# Patient Record
Sex: Female | Born: 1985 | Race: Black or African American | Hispanic: No | Marital: Married | Smoking: Former smoker
Health system: Southern US, Community
[De-identification: ages and names within clinical notes are randomized; demographics above are authoritative.]

## PROBLEM LIST (undated history)

## (undated) DIAGNOSIS — E66811 Obesity, class 1: Secondary | ICD-10-CM

## (undated) DIAGNOSIS — F419 Anxiety disorder, unspecified: Secondary | ICD-10-CM

## (undated) DIAGNOSIS — D219 Benign neoplasm of connective and other soft tissue, unspecified: Secondary | ICD-10-CM

## (undated) DIAGNOSIS — F32A Depression, unspecified: Secondary | ICD-10-CM

## (undated) DIAGNOSIS — E669 Obesity, unspecified: Secondary | ICD-10-CM

## (undated) DIAGNOSIS — D649 Anemia, unspecified: Secondary | ICD-10-CM

## (undated) DIAGNOSIS — O24419 Gestational diabetes mellitus in pregnancy, unspecified control: Secondary | ICD-10-CM

## (undated) DIAGNOSIS — E119 Type 2 diabetes mellitus without complications: Secondary | ICD-10-CM

## (undated) DIAGNOSIS — F329 Major depressive disorder, single episode, unspecified: Secondary | ICD-10-CM

## (undated) HISTORY — DX: Gestational diabetes mellitus in pregnancy, unspecified control: O24.419

## (undated) HISTORY — DX: Obesity, class 1: E66.811

## (undated) HISTORY — DX: Type 2 diabetes mellitus without complications: E11.9

## (undated) HISTORY — DX: Anemia, unspecified: D64.9

## (undated) HISTORY — DX: Obesity, unspecified: E66.9

---
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
## 2004-05-15 ENCOUNTER — Encounter: Admission: RE | Admit: 2004-05-15 | Discharge: 2004-05-15 | Payer: Self-pay | Admitting: Oncology

## 2004-05-15 ENCOUNTER — Encounter (HOSPITAL_COMMUNITY): Admission: RE | Admit: 2004-05-15 | Discharge: 2004-06-14 | Payer: Self-pay | Admitting: Oncology

## 2004-12-11 ENCOUNTER — Ambulatory Visit: Payer: Self-pay | Admitting: Family Medicine

## 2004-12-13 ENCOUNTER — Ambulatory Visit (HOSPITAL_COMMUNITY): Admission: RE | Admit: 2004-12-13 | Discharge: 2004-12-13 | Payer: Self-pay | Admitting: Family Medicine

## 2005-04-23 ENCOUNTER — Ambulatory Visit: Payer: Self-pay | Admitting: Family Medicine

## 2005-04-24 ENCOUNTER — Ambulatory Visit (HOSPITAL_COMMUNITY): Admission: RE | Admit: 2005-04-24 | Discharge: 2005-04-24 | Payer: Self-pay | Admitting: Family Medicine

## 2005-05-09 ENCOUNTER — Encounter (HOSPITAL_COMMUNITY): Admission: RE | Admit: 2005-05-09 | Discharge: 2005-06-08 | Payer: Self-pay | Admitting: Family Medicine

## 2005-05-09 ENCOUNTER — Ambulatory Visit: Payer: Self-pay | Admitting: Family Medicine

## 2005-05-09 ENCOUNTER — Ambulatory Visit (HOSPITAL_COMMUNITY): Payer: Self-pay | Admitting: Family Medicine

## 2005-05-15 ENCOUNTER — Ambulatory Visit: Payer: Self-pay | Admitting: Internal Medicine

## 2005-05-20 ENCOUNTER — Emergency Department (HOSPITAL_COMMUNITY): Admission: EM | Admit: 2005-05-20 | Discharge: 2005-05-20 | Payer: Self-pay | Admitting: Emergency Medicine

## 2005-05-28 ENCOUNTER — Ambulatory Visit (HOSPITAL_COMMUNITY): Admission: RE | Admit: 2005-05-28 | Discharge: 2005-05-28 | Payer: Self-pay | Admitting: Family Medicine

## 2005-05-30 ENCOUNTER — Ambulatory Visit (HOSPITAL_COMMUNITY): Payer: Self-pay | Admitting: Oncology

## 2005-05-30 ENCOUNTER — Encounter: Admission: RE | Admit: 2005-05-30 | Discharge: 2005-05-30 | Payer: Self-pay | Admitting: Oncology

## 2005-06-15 ENCOUNTER — Ambulatory Visit: Payer: Self-pay | Admitting: Family Medicine

## 2005-07-05 ENCOUNTER — Encounter: Admission: RE | Admit: 2005-07-05 | Discharge: 2005-07-06 | Payer: Self-pay | Admitting: Oncology

## 2005-07-05 ENCOUNTER — Encounter (HOSPITAL_COMMUNITY): Admission: RE | Admit: 2005-07-05 | Discharge: 2005-07-06 | Payer: Self-pay | Admitting: Oncology

## 2005-07-09 ENCOUNTER — Encounter: Admission: RE | Admit: 2005-07-09 | Discharge: 2005-07-09 | Payer: Self-pay | Admitting: Oncology

## 2005-07-17 ENCOUNTER — Ambulatory Visit (HOSPITAL_COMMUNITY): Admission: RE | Admit: 2005-07-17 | Discharge: 2005-07-17 | Payer: Self-pay | Admitting: Family Medicine

## 2005-08-10 ENCOUNTER — Ambulatory Visit: Payer: Self-pay | Admitting: Family Medicine

## 2005-11-29 ENCOUNTER — Ambulatory Visit: Payer: Self-pay | Admitting: Family Medicine

## 2005-12-15 ENCOUNTER — Emergency Department (HOSPITAL_COMMUNITY): Admission: EM | Admit: 2005-12-15 | Discharge: 2005-12-15 | Payer: Self-pay | Admitting: *Deleted

## 2005-12-20 ENCOUNTER — Ambulatory Visit: Payer: Self-pay | Admitting: Family Medicine

## 2006-01-07 ENCOUNTER — Encounter (HOSPITAL_COMMUNITY): Admission: RE | Admit: 2006-01-07 | Discharge: 2006-02-06 | Payer: Self-pay | Admitting: Oncology

## 2006-01-07 ENCOUNTER — Encounter: Admission: RE | Admit: 2006-01-07 | Discharge: 2006-01-07 | Payer: Self-pay | Admitting: Oncology

## 2006-01-07 ENCOUNTER — Ambulatory Visit (HOSPITAL_COMMUNITY): Payer: Self-pay | Admitting: Oncology

## 2006-04-18 ENCOUNTER — Ambulatory Visit: Payer: Self-pay | Admitting: Family Medicine

## 2006-07-13 IMAGING — CR DG ABDOMEN ACUTE W/ 1V CHEST
4 series · 4 of 4 positions shown · non-contrast
Comparison: none

CLINICAL DATA: 18-year-old with constipation.  Patient took Sitz marker capsules on [REDACTED], [DATE].  Right-sided abdominal pain. 
 ACUTE ABDOMEN - 2 VIEW WITH CHEST - 1 VIEW:
 Heart size is normal.  Lungs are free of focal consolidations and pleural effusions.  No evidence for free intraperitoneal air.   Supine and erect views are performed of the abdomen, showing significant stool throughout the colon.  One Sitz marker is seen in the region of the distal transverse colon.  A second Sitz marker is seen in the region of the mid descending colon.  Twelve Sitz markers overlie the region of the sigmoid colon.

[view not recorded (1 of 4)]
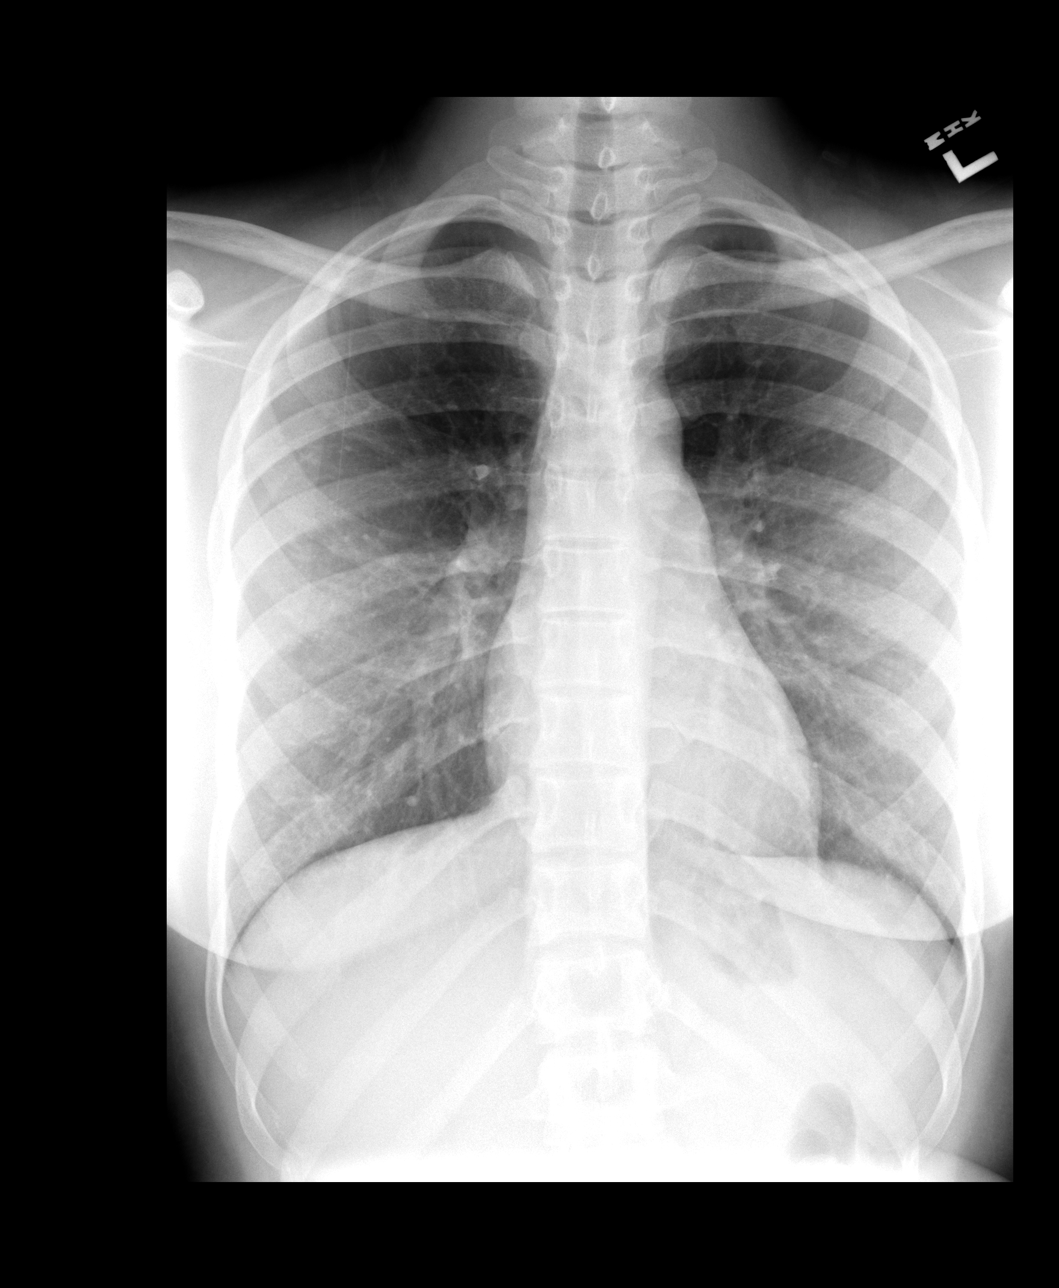

[view not recorded (2 of 4)]
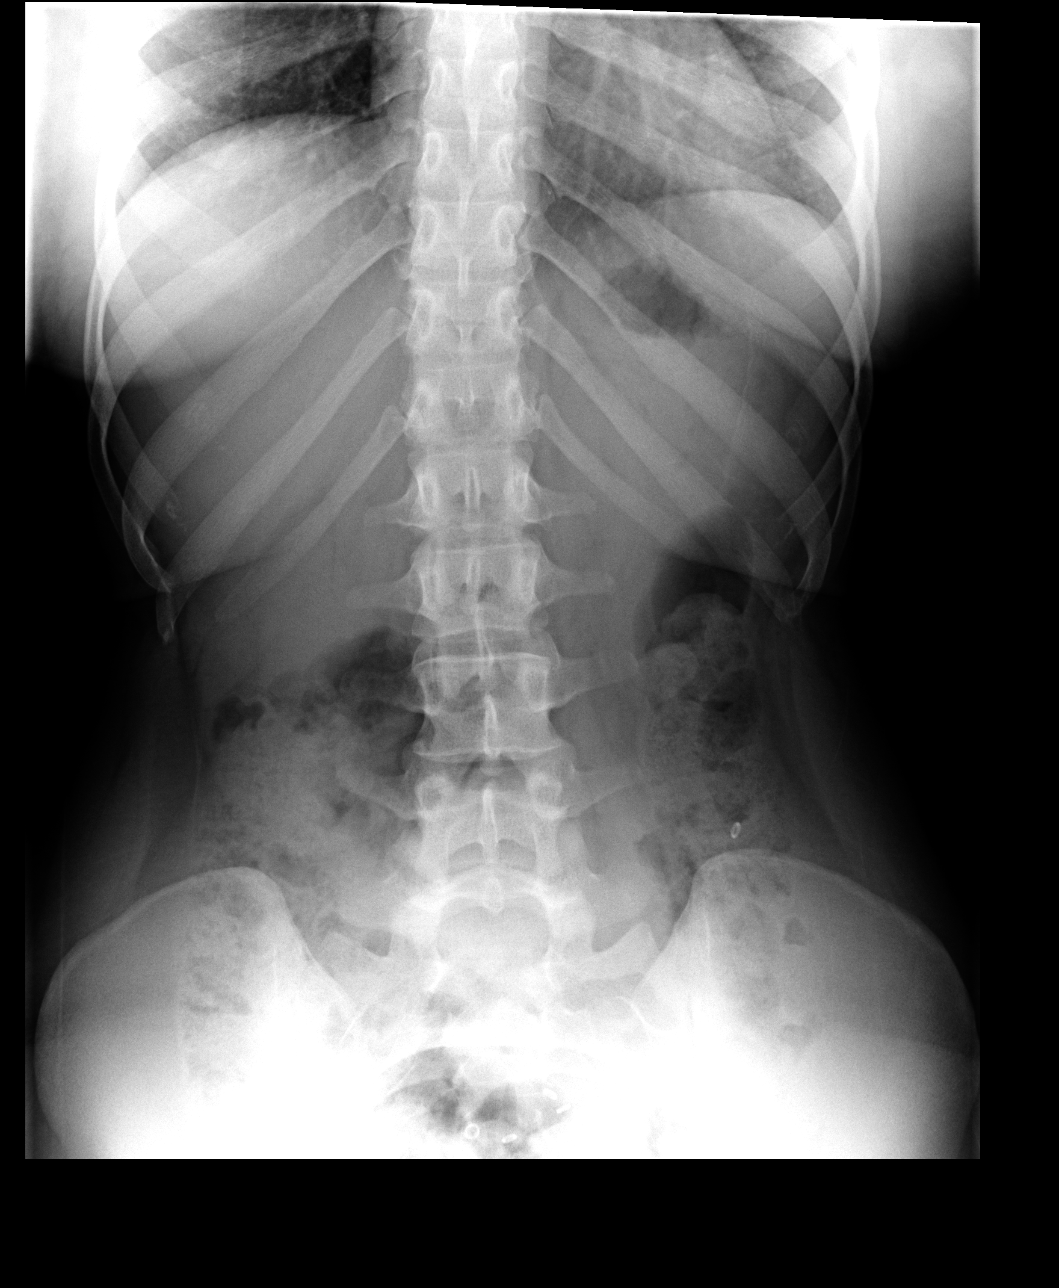

[view not recorded (3 of 4)]
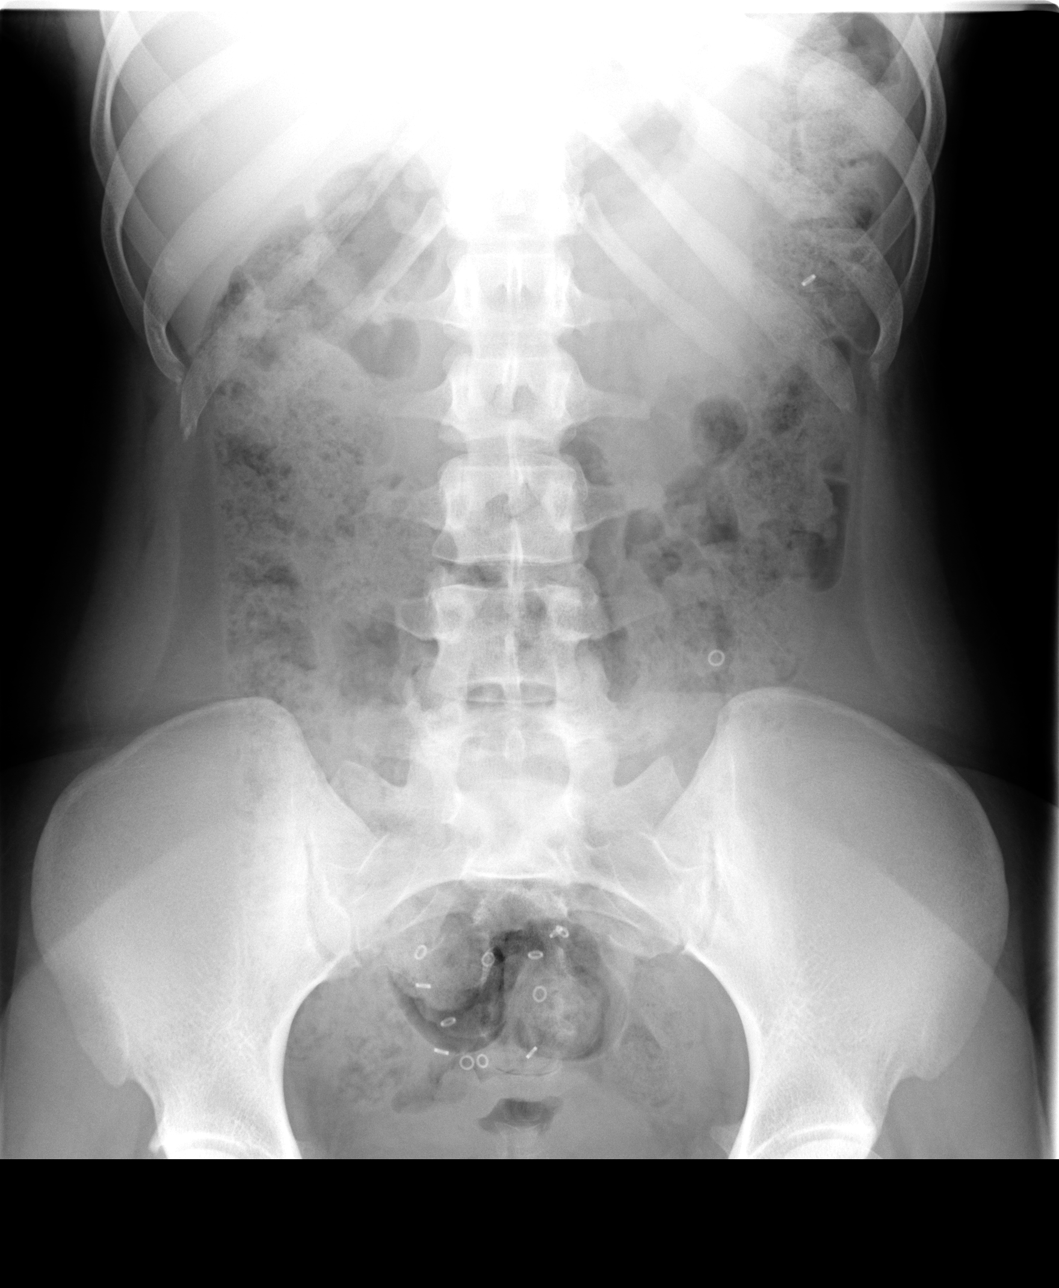

[view not recorded (4 of 4)]
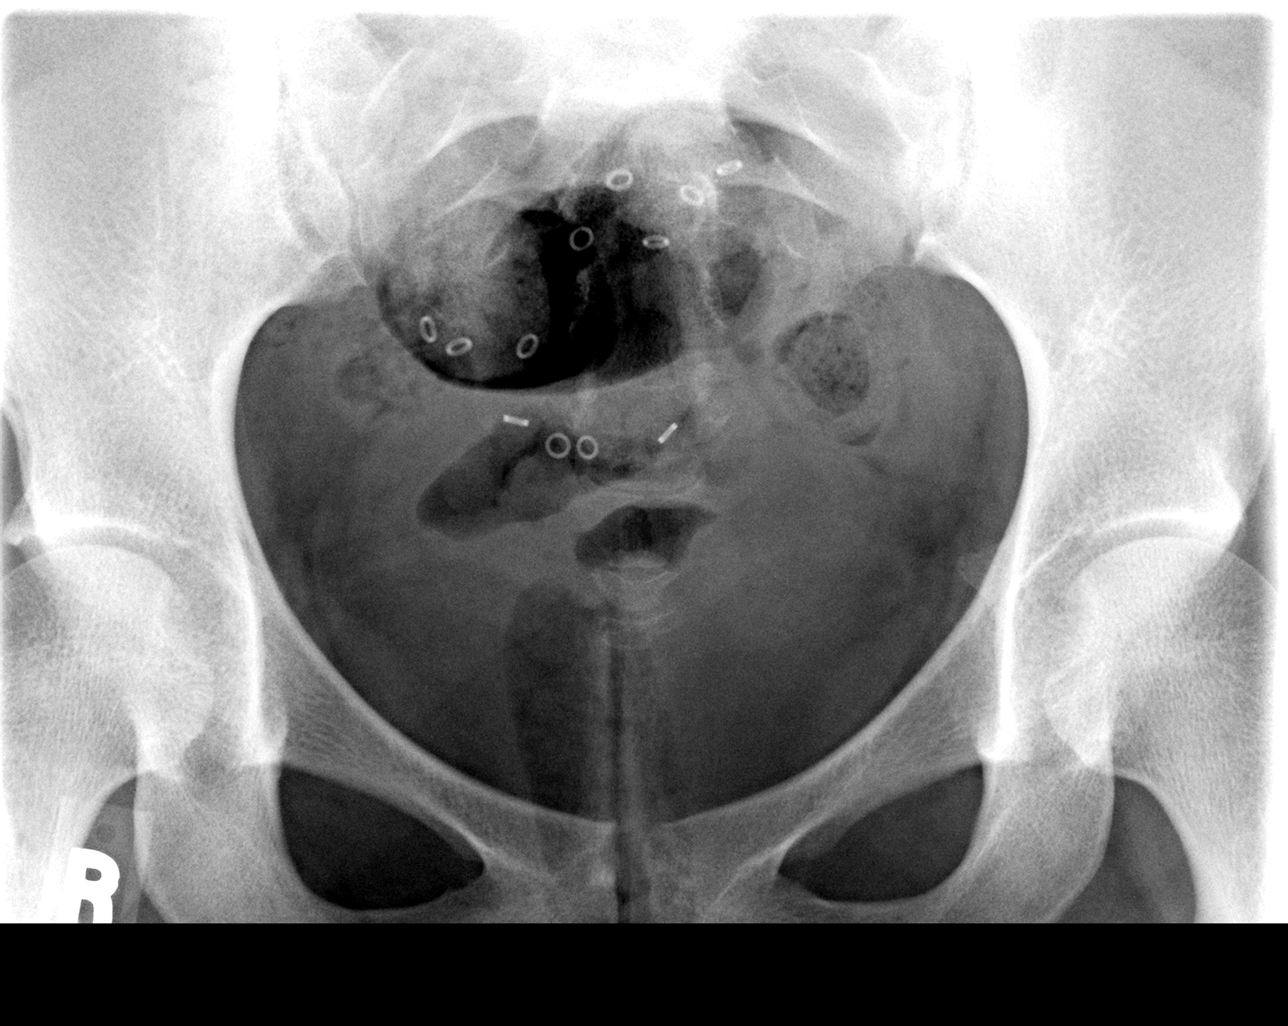

[4 of 4 positions shown; findings below may reference images not displayed]

IMPRESSION: 1.  Sitz markers, as described. 
 2.  Significant stool throughout the colon.

## 2006-07-21 IMAGING — US US TRANSVAGINAL NON-OB
1 series · 14 of 25 positions shown · non-contrast
Comparison: No ultrasound.

CLINICAL DATA: Right adnexal lesion noted on recent CT/heavy menses.
 TRANSABDOMINAL AND TRANSVAGINAL PELVIC ULTRASOUND:
TECHNIQUE: Both transabdominal and transvaginal ultrasound examinations of the pelvis were performed including evaluation of the uterus, ovaries, adnexal regions, and pelvic cul-de-sac.

[Series 1: unknown · 0.32mm/px · 14 of 73 slices shown]
[im 1/73]
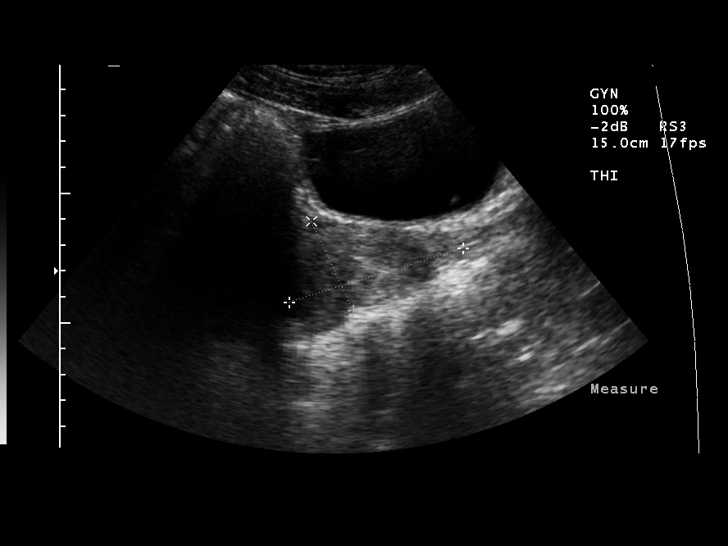
[im 7/73]
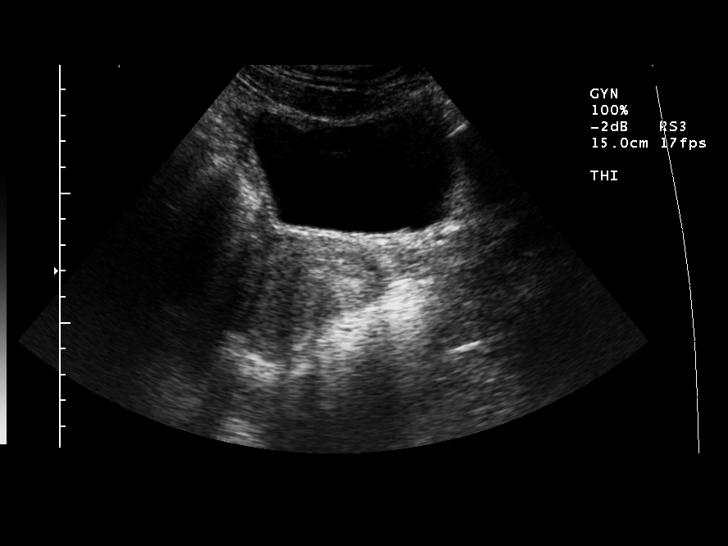
[im 13/73]
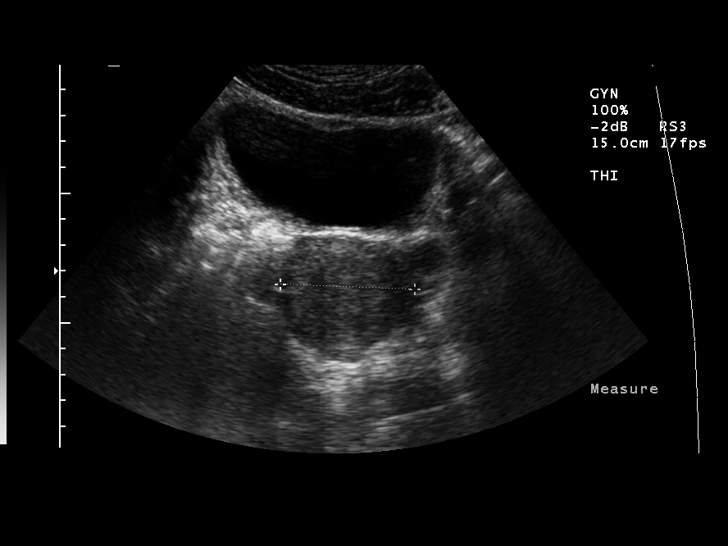
[im 19/73]
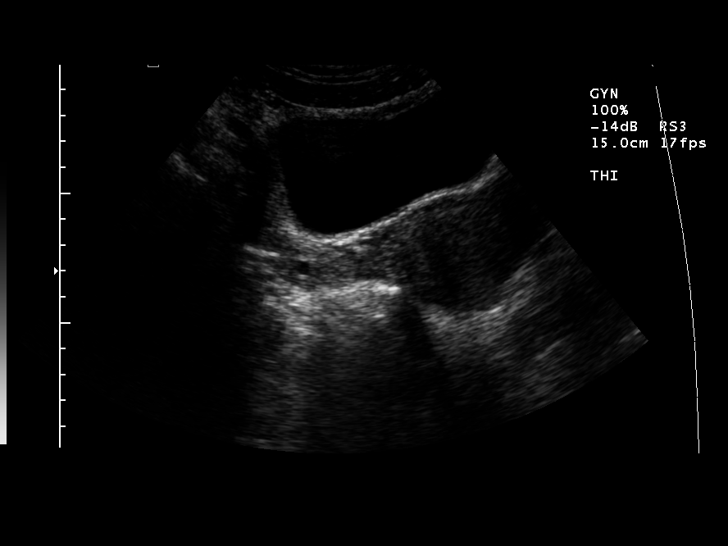
[im 25/73]
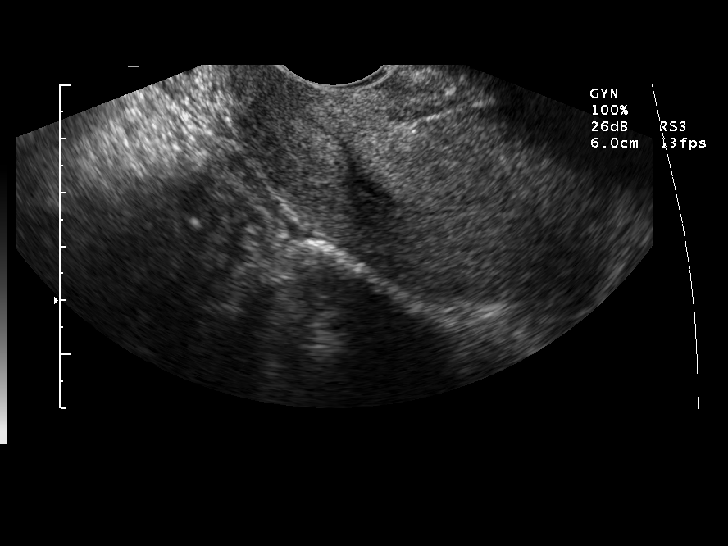
[im 28/73]
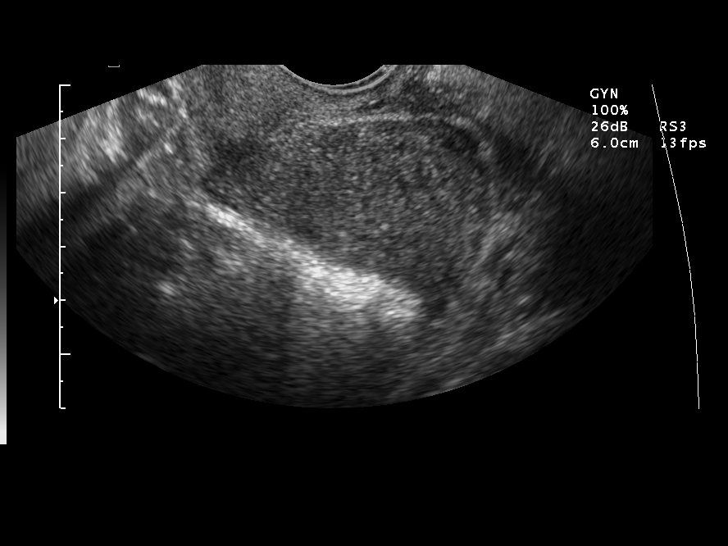
[im 34/73]
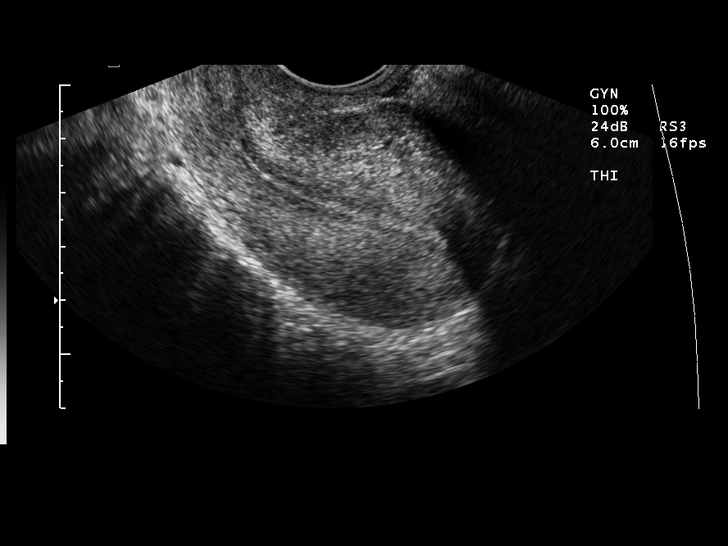
[im 40/73]
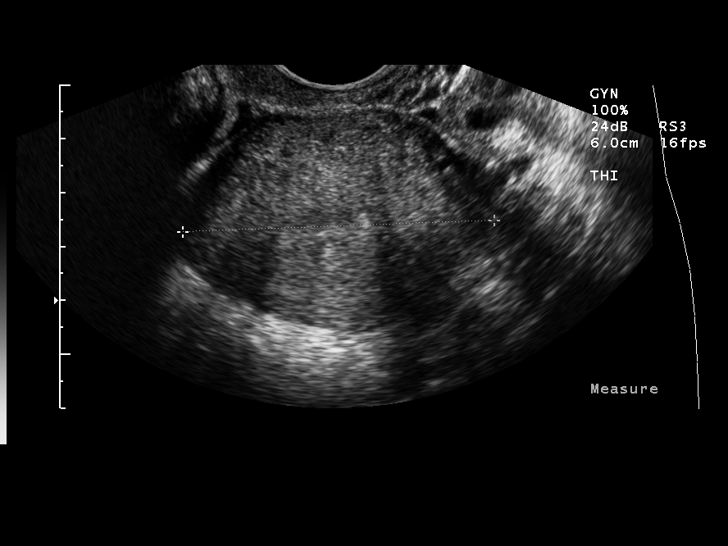
[im 46/73]
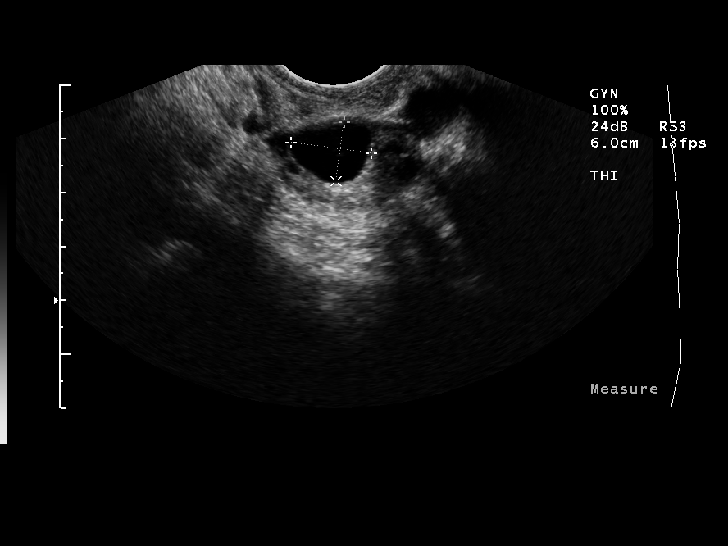
[im 49/73]
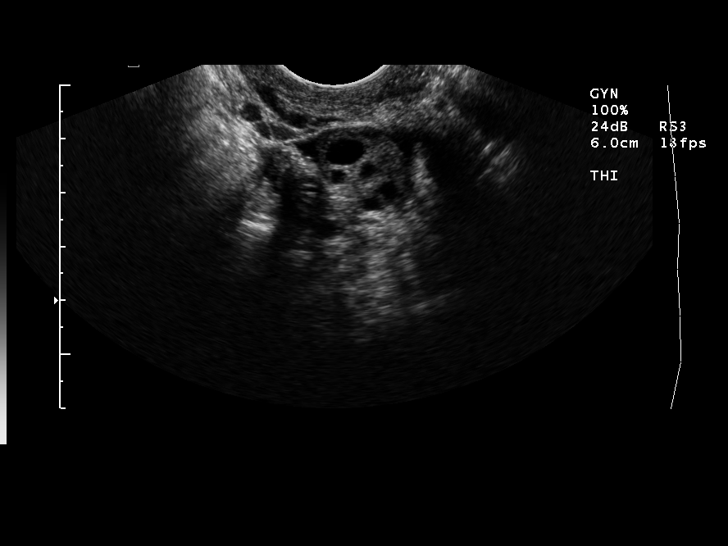
[im 55/73]
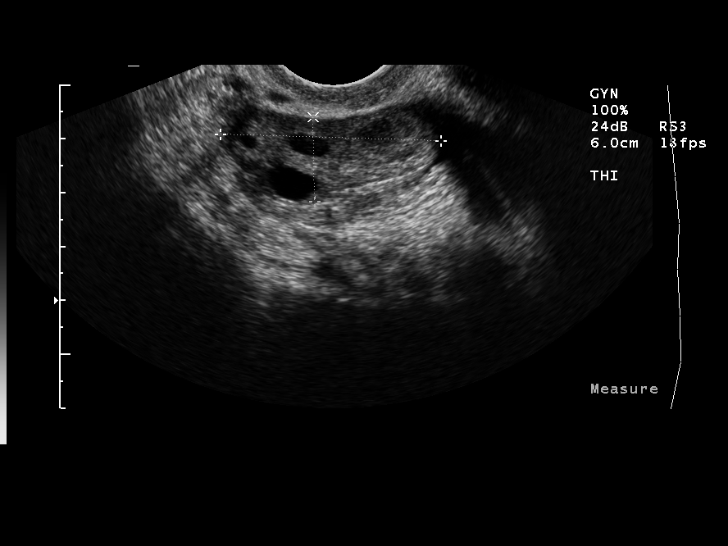
[im 61/73]
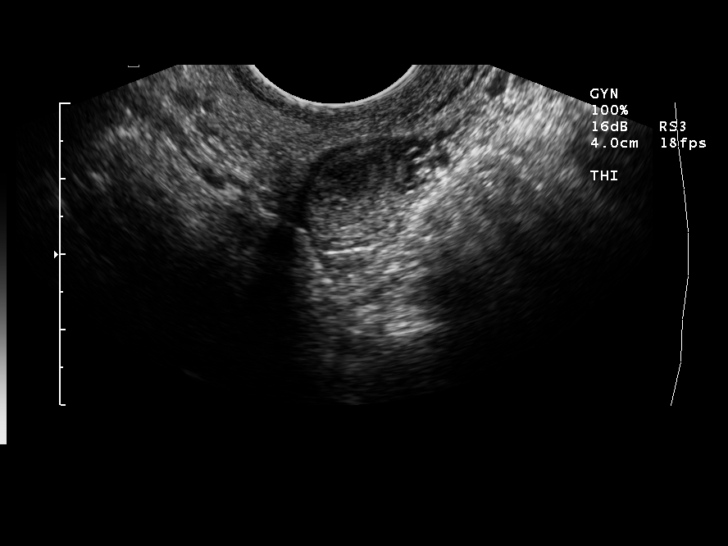
[im 67/73]
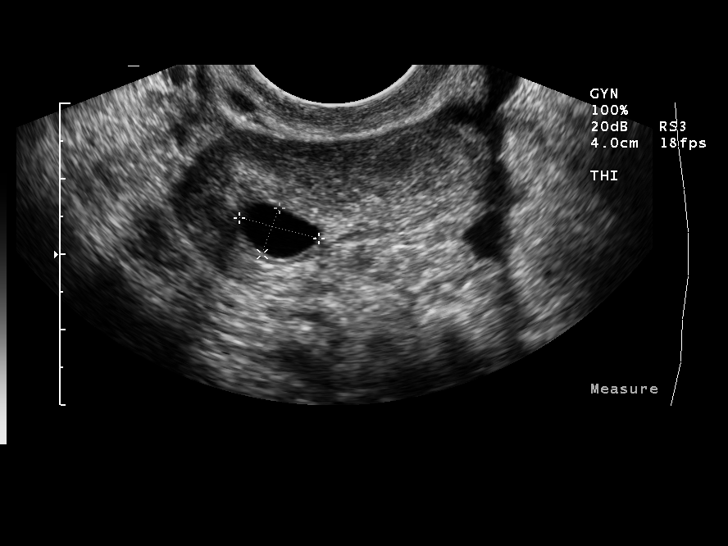
[im 73/73]
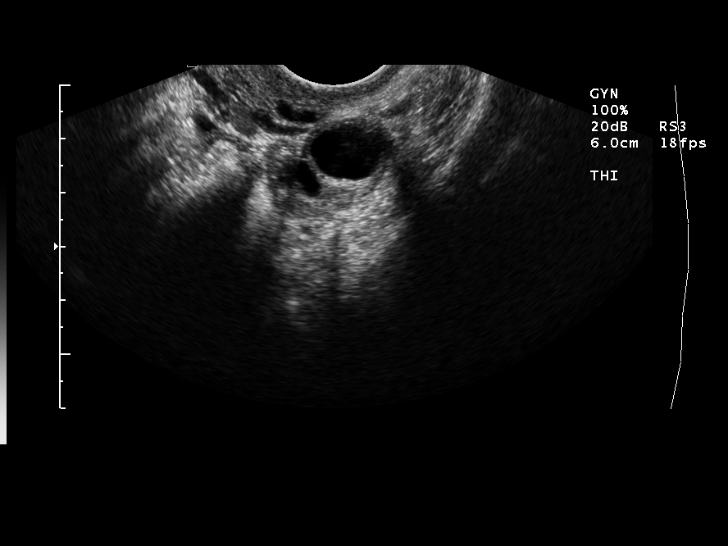

[14 of 25 positions shown; findings below may reference images not displayed]

Today?s exam is correlated with a CT dated 04/24/05.
 The uterus is retroverted but otherwise unremarkable.  No lesions of the myometrium or endometrium.  The right ovary shows a thick-walled cystic lesion that correlates with the CT.  This is likely an involuting corpus luteum cyst.  This lesion measures 1.8 x 1.0 x 1.3 cm.    However, it needs follow-up in four to six weeks.  There are also multiple right ovarian follicles.  The left ovary also shows multiple small follicles. No ovarian enlargement. There is a small amount of fluid in the cul-de-sac that I would regard as physiological.
IMPRESSION: 1.  Retroverted uterus.
 2.  There is a complex cyst in the right adnexa measuring 1.8 x 1.0 x 1.3 cm.  This is likely an involuted or collapsed cyst, but needs follow-up. See above.

## 2006-07-22 ENCOUNTER — Encounter: Admission: RE | Admit: 2006-07-22 | Discharge: 2006-07-22 | Payer: Self-pay | Admitting: Oncology

## 2006-07-22 ENCOUNTER — Encounter (HOSPITAL_COMMUNITY): Admission: RE | Admit: 2006-07-22 | Discharge: 2006-08-21 | Payer: Self-pay | Admitting: Oncology

## 2006-07-22 ENCOUNTER — Ambulatory Visit (HOSPITAL_COMMUNITY): Payer: Self-pay | Admitting: Oncology

## 2006-12-04 ENCOUNTER — Ambulatory Visit: Payer: Self-pay | Admitting: Family Medicine

## 2006-12-04 ENCOUNTER — Encounter: Payer: Self-pay | Admitting: Family Medicine

## 2006-12-04 LAB — CONVERTED CEMR LAB: Pap Smear: NORMAL

## 2006-12-05 ENCOUNTER — Other Ambulatory Visit: Admission: RE | Admit: 2006-12-05 | Discharge: 2006-12-05 | Payer: Self-pay | Admitting: Family Medicine

## 2006-12-06 ENCOUNTER — Encounter: Payer: Self-pay | Admitting: Family Medicine

## 2006-12-06 LAB — CONVERTED CEMR LAB
Candida species: NEGATIVE
Chlamydia, DNA Probe: NEGATIVE
GC Probe Amp, Genital: NEGATIVE
Gardnerella vaginalis: NEGATIVE
Trichomonal Vaginitis: NEGATIVE

## 2007-09-15 ENCOUNTER — Emergency Department (HOSPITAL_COMMUNITY): Admission: EM | Admit: 2007-09-15 | Discharge: 2007-09-15 | Payer: Self-pay | Admitting: Emergency Medicine

## 2007-10-09 ENCOUNTER — Encounter: Payer: Self-pay | Admitting: Family Medicine

## 2008-04-23 ENCOUNTER — Encounter: Payer: Self-pay | Admitting: Family Medicine

## 2008-04-23 DIAGNOSIS — N6019 Diffuse cystic mastopathy of unspecified breast: Secondary | ICD-10-CM | POA: Insufficient documentation

## 2008-04-23 DIAGNOSIS — F909 Attention-deficit hyperactivity disorder, unspecified type: Secondary | ICD-10-CM | POA: Insufficient documentation

## 2008-04-23 DIAGNOSIS — D508 Other iron deficiency anemias: Secondary | ICD-10-CM | POA: Insufficient documentation

## 2008-04-23 DIAGNOSIS — J309 Allergic rhinitis, unspecified: Secondary | ICD-10-CM | POA: Insufficient documentation

## 2008-09-26 ENCOUNTER — Emergency Department (HOSPITAL_COMMUNITY): Admission: EM | Admit: 2008-09-26 | Discharge: 2008-09-26 | Payer: Self-pay | Admitting: Emergency Medicine

## 2008-10-02 ENCOUNTER — Emergency Department (HOSPITAL_COMMUNITY): Admission: EM | Admit: 2008-10-02 | Discharge: 2008-10-02 | Payer: Self-pay | Admitting: Emergency Medicine

## 2008-10-07 ENCOUNTER — Ambulatory Visit: Payer: Self-pay | Admitting: Family Medicine

## 2008-10-07 DIAGNOSIS — K3189 Other diseases of stomach and duodenum: Secondary | ICD-10-CM | POA: Insufficient documentation

## 2008-10-07 DIAGNOSIS — R1013 Epigastric pain: Secondary | ICD-10-CM

## 2008-10-09 DIAGNOSIS — L2089 Other atopic dermatitis: Secondary | ICD-10-CM | POA: Insufficient documentation

## 2008-10-09 DIAGNOSIS — B009 Herpesviral infection, unspecified: Secondary | ICD-10-CM | POA: Insufficient documentation

## 2008-10-13 ENCOUNTER — Ambulatory Visit (HOSPITAL_COMMUNITY): Admission: RE | Admit: 2008-10-13 | Discharge: 2008-10-13 | Payer: Self-pay | Admitting: Family Medicine

## 2008-10-15 ENCOUNTER — Encounter (HOSPITAL_COMMUNITY): Admission: RE | Admit: 2008-10-15 | Discharge: 2008-11-14 | Payer: Self-pay | Admitting: Family Medicine

## 2008-10-18 ENCOUNTER — Ambulatory Visit: Payer: Self-pay | Admitting: Family Medicine

## 2008-10-18 ENCOUNTER — Other Ambulatory Visit: Admission: RE | Admit: 2008-10-18 | Discharge: 2008-10-18 | Payer: Self-pay | Admitting: Family Medicine

## 2008-10-18 ENCOUNTER — Encounter: Payer: Self-pay | Admitting: Family Medicine

## 2008-10-18 DIAGNOSIS — R112 Nausea with vomiting, unspecified: Secondary | ICD-10-CM | POA: Insufficient documentation

## 2008-10-18 DIAGNOSIS — N760 Acute vaginitis: Secondary | ICD-10-CM | POA: Insufficient documentation

## 2008-10-20 ENCOUNTER — Encounter: Payer: Self-pay | Admitting: Family Medicine

## 2008-10-20 LAB — CONVERTED CEMR LAB
Candida species: NEGATIVE
Chlamydia, DNA Probe: NEGATIVE
GC Probe Amp, Genital: NEGATIVE
Gardnerella vaginalis: POSITIVE — AB
Trichomonal Vaginitis: NEGATIVE

## 2009-03-08 ENCOUNTER — Telehealth: Payer: Self-pay | Admitting: Family Medicine

## 2009-06-03 ENCOUNTER — Ambulatory Visit: Payer: Self-pay | Admitting: Family Medicine

## 2009-06-03 LAB — CONVERTED CEMR LAB: Hemoglobin: 10.4 g/dL

## 2009-11-11 ENCOUNTER — Ambulatory Visit: Payer: Self-pay | Admitting: Family Medicine

## 2009-11-11 DIAGNOSIS — E663 Overweight: Secondary | ICD-10-CM | POA: Insufficient documentation

## 2009-11-11 DIAGNOSIS — R5383 Other fatigue: Secondary | ICD-10-CM | POA: Insufficient documentation

## 2010-01-13 ENCOUNTER — Ambulatory Visit: Payer: Self-pay | Admitting: Family Medicine

## 2010-01-13 ENCOUNTER — Encounter (INDEPENDENT_AMBULATORY_CARE_PROVIDER_SITE_OTHER): Payer: Self-pay

## 2010-01-13 DIAGNOSIS — J019 Acute sinusitis, unspecified: Secondary | ICD-10-CM | POA: Insufficient documentation

## 2010-01-13 DIAGNOSIS — J029 Acute pharyngitis, unspecified: Secondary | ICD-10-CM | POA: Insufficient documentation

## 2010-01-13 DIAGNOSIS — J209 Acute bronchitis, unspecified: Secondary | ICD-10-CM | POA: Insufficient documentation

## 2010-01-13 LAB — CONVERTED CEMR LAB: Rapid Strep: NEGATIVE

## 2010-01-16 LAB — CONVERTED CEMR LAB
BUN: 9 mg/dL (ref 6–23)
Basophils Absolute: 0 10*3/uL (ref 0.0–0.1)
Basophils Relative: 0 % (ref 0–1)
CO2: 24 meq/L (ref 19–32)
Calcium: 9.6 mg/dL (ref 8.4–10.5)
Chloride: 103 meq/L (ref 96–112)
Cholesterol: 173 mg/dL (ref 0–200)
Creatinine, Ser: 0.64 mg/dL (ref 0.40–1.20)
Eosinophils Absolute: 0.2 10*3/uL (ref 0.0–0.7)
Eosinophils Relative: 2 % (ref 0–5)
Glucose, Bld: 82 mg/dL (ref 70–99)
HCT: 43.2 % (ref 36.0–46.0)
HDL: 29 mg/dL — ABNORMAL LOW (ref >39)
Hemoglobin: 14.5 g/dL (ref 12.0–15.0)
LDL Cholesterol: 126 mg/dL — ABNORMAL HIGH (ref 0–99)
Lymphocytes Relative: 34 % (ref 12–46)
Lymphs Abs: 2.8 10*3/uL (ref 0.7–4.0)
MCHC: 33.6 g/dL (ref 30.0–36.0)
MCV: 95.4 fL (ref 78.0–100.0)
Monocytes Absolute: 0.5 10*3/uL (ref 0.1–1.0)
Monocytes Relative: 6 % (ref 3–12)
Neutro Abs: 4.9 10*3/uL (ref 1.7–7.7)
Neutrophils Relative %: 59 % (ref 43–77)
Platelets: 333 10*3/uL (ref 150–400)
Potassium: 4.2 meq/L (ref 3.5–5.3)
RBC: 4.53 M/uL (ref 3.87–5.11)
RDW: 13.5 % (ref 11.5–15.5)
Sodium: 138 meq/L (ref 135–145)
TSH: 1.353 microintl units/mL (ref 0.350–4.500)
Total CHOL/HDL Ratio: 6
Triglycerides: 90 mg/dL (ref <150)
VLDL: 18 mg/dL (ref 0–40)
WBC: 8.4 10*3/uL (ref 4.0–10.5)

## 2010-03-29 ENCOUNTER — Telehealth: Payer: Self-pay | Admitting: Family Medicine

## 2010-04-12 ENCOUNTER — Encounter: Payer: Self-pay | Admitting: Family Medicine

## 2010-04-12 ENCOUNTER — Telehealth: Payer: Self-pay | Admitting: Family Medicine

## 2010-06-19 ENCOUNTER — Telehealth: Payer: Self-pay | Admitting: Family Medicine

## 2010-11-09 NOTE — Letter (Signed)
Summary: labs  labs   Imported By: Curtis Sites 03/24/2010 13:10:20  _____________________________________________________________________  External Attachment:    Type:   Image     Comment:   External Document

## 2010-11-09 NOTE — Letter (Signed)
Summary: history and physical  history and physical   Imported By: Curtis Sites 03/24/2010 13:10:48  _____________________________________________________________________  External Attachment:    Type:   Image     Comment:   External Document

## 2010-11-09 NOTE — Progress Notes (Signed)
Summary: lab  Phone Note Call from Patient   Summary of Call: Patient would like to know if she can go and get blood work done, she needs it to show that she has had chicken pox. Please advise thanks Initial call taken by: Curtis Sites,  April 12, 2010 3:17 PM  Follow-up for Phone Call        pls order varicella titer, let lab know we are establishing pt had chicken pox and verify correct code with them then order for pt Follow-up by: Syliva Overman MD,  April 12, 2010 3:30 PM  Additional Follow-up for Phone Call Additional follow up Details #1::        lab ordered, patient aware Additional Follow-up by: Adella Hare LPN,  April 12, 1609 4:20 PM

## 2010-11-09 NOTE — Assessment & Plan Note (Signed)
Summary: PHY   Vital Signs:  Patient profile:   25 year old female Menstrual status:  implanon LMP:     10/19/2009 Height:      63 inches Weight:      168.50 pounds BMI:     29.96 O2 Sat:      98 % Pulse rate:   106 / minute Pulse rhythm:   regular Resp:     16 per minute BP sitting:   114 / 84  (right arm) Cuff size:   regular  Vitals Entered By: Everitt Amber LPN (January 14, 4539 10:45 AM)  Nutrition Counseling: Patient's BMI is greater than 25 and therefore counseled on weight management options. CC: needs a physical for school. no pap  Vision Screening:Left eye w/o correction: 20 / 20 Right Eye w/o correction: 20 / 20 Both eyes w/o correction:  20/ 20  Color vision testing: normal      Vision Entered By: Everitt Amber LPN (January 13, 9810 11:15 AM) LMP (date): 10/19/2009     Enter LMP: 10/19/2009 Last PAP Result NEGATIVE FOR INTRAEPITHELIAL LESIONS OR MALIGNANCY.   CC:  needs a physical for school. no pap.  History of Present Illness: 6 day h/o increasing head and chest congestion, with yellow sputum, fever and chills. She  has done extremely well with lifestyle modification  along with use of phentermine, she has lost on avg 5.5 pounds each month.She remains highly motivaed. She is requesting that a form be completed for her to state she is in good health and her immunzationisup to date, which is the case except for this acute illness. This is for school purposes.   Current Medications (verified): 1)  Phentermine Hcl 37.5 Mg Tabs (Phentermine Hcl) .... Take 1 Tablet By Mouth Once A Day  Allergies (verified): 1)  ! Penicillin  Review of Systems      See HPI General:  Complains of chills, fatigue, fever, malaise, sweats, and weakness; 3 day history, slightly better.. Eyes:  Denies blurring and discharge. ENT:  Complains of earache, hoarseness, nasal congestion, sinus pressure, and sore throat; 6 day history, symptoms are worsening. CV:  Denies difficulty  breathing at night, palpitations, and swelling of feet. Resp:  Complains of cough and sputum productive; yellow sputum. GI:  Denies abdominal pain, constipation, diarrhea, nausea, and vomiting. GU:  Denies dysuria and urinary frequency. MS:  Denies joint redness and stiffness. Psych:  Denies anxiety and depression. Heme:  Denies abnormal bruising and bleeding. Allergy:  Complains of seasonal allergies.  Physical Exam  General:  Well-developed,overweight,in no acute distress; alert,appropriate and cooperative throughout examination HEENT: No facial asymmetry,  EOMI, No sinus tenderness, TM's Clear, oropharynx  pink and moist.   Chest: decreased air entry crackles and wheezes CVS: S1, S2, No murmurs, No S3.   Abd: Soft, Nontender.  MS: Adequate ROM spine, hips, shoulders and knees.  Ext: No edema.   CNS: CN 2-12 intact, power tone and sensation normal throughout.   Skin: Intact, no visible lesions or rashes.  Psych: Good eye contact, normal affect.  Memory intact, not anxious or depressed appearing.    Impression & Recommendations:  Problem # 1:  ACUTE PHARYNGITIS (ICD-462) Assessment Comment Only  The following medications were removed from the medication list:    Metronidazole 500 Mg Tabs (Metronidazole) .Marland Kitchen... Take 1 tablet by mouth two times a day Her updated medication list for this problem includes:    Zithromax Z-pak 250 Mg Tabs (Azithromycin) ..... Use as directed  Orders: Rapid Strep (91478)  negative  Problem # 2:  ACUTE BRONCHITIS (ICD-466.0) Assessment: Comment Only  The following medications were removed from the medication list:    Metronidazole 500 Mg Tabs (Metronidazole) .Marland Kitchen... Take 1 tablet by mouth two times a day Her updated medication list for this problem includes:    Zithromax Z-pak 250 Mg Tabs (Azithromycin) ..... Use as directed  Problem # 3:  OBESITY (ICD-278.00) Assessment: Improved  Ht: 63 (01/13/2010)   Wt: 168.50 (01/13/2010)   BMI: 29.96  (01/13/2010), pt exercises on avg 5 days per week, is tolerating phentermine well , and has lost on avg 5 days per week  Problem # 4:  ACUTE SINUSITIS, UNSPECIFIED (ICD-461.9) Assessment: Comment Only  The following medications were removed from the medication list:    Metronidazole 500 Mg Tabs (Metronidazole) .Marland Kitchen... Take 1 tablet by mouth two times a day Her updated medication list for this problem includes:    Zithromax Z-pak 250 Mg Tabs (Azithromycin) ..... Use as directed  Complete Medication List: 1)  Phentermine Hcl 37.5 Mg Tabs (Phentermine hcl) .... Take 1 tablet by mouth once a day 2)  Zithromax Z-pak 250 Mg Tabs (Azithromycin) .... Use as directed  Other Orders: T-Basic Metabolic Panel [id number]) T-Lipid Profile [id number]) T-CBC w/Diff [id number]) T-TSH [id number])  Patient Instructions: 1)  F/U in 3 .5 months 2)  It is important that you exercise regularly at least 20 minutes 5 times a week. If you develop chest pain, have severe difficulty breathing, or feel very tired , stop exercising immediately and seek medical attention. 3)  You need to lose weight. Consider a lower calorie diet and regular exercise. CONGRATS on yor 11 pound weight loss, keep it up 4)  BMP prior to visit, ICD-9: 5)  Lipid Panel prior to visit, ICD-9:   fasting today 6)  TSH prior to visit, ICD-9: 7)  CBC w/ Diff prior to visit, ICD-9: 8)  You are being treated for sinusitis and bronchitis, pls take all meds prescribed. Prescriptions: ZITHROMAX Z-PAK 250 MG TABS (AZITHROMYCIN) Use as directed  #6 x 0   Entered and Authorized by:   Syliva Overman MD   Signed by:   Syliva Overman MD on 01/13/2010   Method used:   Electronically to        [facility] [address] [phone]* (retail)       [address]       [place], [place]  [address]       Ph: [phone] or [phone]       Fax: [phone]   RxID:   [id number] PHENTERMINE HCL 37.5 MG TABS (PHENTERMINE HCL) Take 1 tablet by  mouth once a day  #30 x 1   Entered and Authorized by:   Syliva Overman MD   Signed by:   Syliva Overman MD on 01/13/2010   Method used:   Printed then faxed to ...       [facility] [phone]* (retail)       [address]       [place], [place]  [address]       Ph: [phone] or [phone]       Fax: [phone]   RxID:   [id number]   Laboratory Results    Other Tests  Rapid Strep: negative

## 2010-11-09 NOTE — Letter (Signed)
Summary: misc.  misc.   Imported By: Curtis Sites 03/24/2010 13:05:39  _____________________________________________________________________  External Attachment:    Type:   Image     Comment:   External Document

## 2010-11-09 NOTE — Assessment & Plan Note (Signed)
Summary: office visit   Vital Signs:  Patient profile:   25 year old female Menstrual status:  implanon Height:      63 inches Weight:      179.58 pounds BMI:     31.93 O2 Sat:      98 % Pulse rate:   95 / minute Pulse rhythm:   regular Resp:     16 per minute BP sitting:   114 / 80  Vitals Entered By: Everitt Amber (November 11, 2009 10:53 AM)  Nutrition Counseling: Patient's BMI is greater than 25 and therefore counseled on weight management options. CC: Wants some weight loss pills, thinks she had a falre up of bv. had some discharge but not anymore     Menstrual Status implanon Last PAP Result NEGATIVE FOR INTRAEPITHELIAL LESIONS OR MALIGNANCY.   CC:  Wants some weight loss pills and thinks she had a falre up of bv. had some discharge but not anymore.  History of Present Illness: Reports  that she has been doing well. She is very concerned however about persitent weight gain, and styates that because of stress in her life and her hectic schedule , she is extremely challenged and pushed to reign in her runaway apetitie, and she needs help. She also intends to commit to reg exercise , she feels tired with theweigth gain. Denies recent fever or chills. Denies sinus pressure, nasal congestion , ear pain or sore throat. Denies chest congestion, or cough productive of sputum. Denies chest pain, palpitations, PND, orthopnea or leg swelling. Denies abdominal pain, nausea, vomitting, diarrhea or constipation. Denies change in bowel movements or bloody stool. Denies dysuria , frequency, incontinence or hesitancy.She recently ahs developed a fishy vag d/c , which she suspects is bV, and wants help for this. Denies  joint pain, swelling, or reduced mobility. Denies headaches, vertigo, seizures. Denies depression, anxiety or insomnia. Denies  rash, lesions, or itch.     Current Medications (verified): 1)  None  Allergies (verified): 1)  ! Penicillin  Review of Systems  See HPI Eyes:  Denies blurring and discharge. GU:  Complains of discharge. Heme:  Denies abnormal bruising and bleeding.  Physical Exam  General:  Well-developedobese,in no acute distress; alert,appropriate and cooperative throughout examination HEENT: No facial asymmetry,  EOMI, No sinus tenderness, TM's Clear, oropharynx  pink and moist.   Chest: Clear to auscultation bilaterally.  CVS: S1, S2, No murmurs, No S3.   Abd: Soft, Nontender.  MS: Adequate ROM spine, hips, shoulders and knees.  Ext: No edema.   CNS: CN 2-12 intact, power tone and sensation normal throughout.   Skin: Intact, no visible lesions or rashes.  Psych: Good eye contact, normal affect.  Memory intact, not anxious or depressed appearing.    Impression & Recommendations:  Problem # 1:  OBESITY (ICD-278.00) Assessment Deteriorated  Ht: 63 (11/11/2009)   Wt: 179.58 (11/11/2009)   BMI: 31.93 (11/11/2009)  Problem # 2:  VAGINITIS (ICD-616.10) Assessment: Comment Only  The following medications were removed from the medication list:    Flagyl 500 Mg Tabs (Metronidazole) .Marland Kitchen... Take 1 tablet by mouth two times a day Her updated medication list for this problem includes:    Metronidazole 500 Mg Tabs (Metronidazole) .Marland Kitchen... Take 1 tablet by mouth two times a day empiric treatment only  Complete Medication List: 1)  Phentermine Hcl 37.5 Mg Tabs (Phentermine hcl) .... Take 1 tablet by mouth once a day 2)  Metronidazole 500 Mg Tabs (Metronidazole) .... Take 1  tablet by mouth two times a day  Other Orders: T-CBC w/Diff ([id number]) T-Basic Metabolic Panel [id number]) T-Lipid Profile [id number]) T-TSH [phone])  Patient Instructions: 1)  Please schedule a follow-up appointment in 2 months. 2)  It is important that you exercise regularly at least 40 minutes 5 times a week. If you develop chest pain, have severe difficulty breathing, or feel very tired , stop exercising immediately and seek medical  attention. 3)  You need to lose weight. Consider a lower calorie diet and regular exercise. 4)  PLS tsk the phentermine HALF tablet daily in the morning, reduce caffeine intake , and commit to healthy food choice Prescriptions: METRONIDAZOLE 500 MG TABS (METRONIDAZOLE) Take 1 tablet by mouth two times a day  #14 x 0   Entered and Authorized by:   Syliva Overman MD   Signed by:   Syliva Overman MD on 11/11/2009   Method used:   Print then Give to Patient   RxID:   [id number] PHENTERMINE HCL 37.5 MG TABS (PHENTERMINE HCL) Take 1 tablet by mouth once a day  #30 x 0   Entered and Authorized by:   Syliva Overman MD   Signed by:   Syliva Overman MD on 11/11/2009   Method used:   Print then Give to Patient   RxID:   [id number]

## 2010-11-09 NOTE — Letter (Signed)
Summary: Out of Work  [facility]  [address]   [place], [place] [address]   Phone: [phone]  Fax: [phone]    January 13, 2010   Employee:  KATHYE CIPRIANI [employer]    To Whom It May Concern:   For Medical reasons, please excuse the above named employee from work for the following dates:  Start:   01/12/2010  End:   01/13/2010 Without Restricitons  If you need additional information, please feel free to contact our office.         Sincerely,    Milus Mallick. Lodema Hong, M.D.

## 2010-11-09 NOTE — Letter (Signed)
Summary: progress notes  progress notes   Imported By: Curtis Sites 03/24/2010 13:11:27  _____________________________________________________________________  External Attachment:    Type:   Image     Comment:   External Document

## 2010-11-09 NOTE — Letter (Signed)
Summary: consult  consult   Imported By: Curtis Sites 03/24/2010 13:06:46  _____________________________________________________________________  External Attachment:    Type:   Image     Comment:   External Document

## 2010-11-09 NOTE — Progress Notes (Signed)
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

## 2010-11-09 NOTE — Letter (Signed)
Summary: xray  xray   Imported By: Curtis Sites 03/24/2010 13:09:17  _____________________________________________________________________  External Attachment:    Type:   Image     Comment:   External Document

## 2010-11-09 NOTE — Letter (Signed)
Summary: demographic  demographic   Imported By: Curtis Sites 03/24/2010 13:11:59  _____________________________________________________________________  External Attachment:    Type:   Image     Comment:   External Document

## 2010-11-09 NOTE — Progress Notes (Signed)
Summary: shot record  Phone Note Call from Patient   Summary of Call: needs to speak with nurse about shot record. [phone]   fax [phone] Initial call taken by: Rudene Anda,  June 19, 2010 11:06 AM  Follow-up for Phone Call        returned call, left message Follow-up by: Adella Hare LPN,  June 19, 2010 3:28 PM  Additional Follow-up for Phone Call Additional follow up Details #1::        faxed as requested by patient Additional Follow-up by: Adella Hare LPN,  June 19, 2010 3:31 PM

## 2010-11-09 NOTE — Letter (Signed)
Summary: phone notes  phone notes   Imported By: Curtis Sites 03/24/2010 13:06:10  _____________________________________________________________________  External Attachment:    Type:   Image     Comment:   External Document

## 2011-01-01 ENCOUNTER — Telehealth: Payer: Self-pay | Admitting: Family Medicine

## 2011-07-06 NOTE — Telephone Encounter (Signed)
done

## 2011-07-13 LAB — COMPREHENSIVE METABOLIC PANEL
ALT: 22 U/L (ref 0–35)
AST: 19 U/L (ref 0–37)
Albumin: 3.5 g/dL (ref 3.5–5.2)
Alkaline Phosphatase: 97 U/L (ref 39–117)
BUN: 9 mg/dL (ref 6–23)
CO2: 19 mEq/L (ref 19–32)
Calcium: 8.8 mg/dL (ref 8.4–10.5)
Chloride: 109 mEq/L (ref 96–112)
Creatinine, Ser: 0.55 mg/dL (ref 0.4–1.2)
GFR calc Af Amer: >60 mL/min (ref >60)
GFR calc non Af Amer: >60 mL/min (ref >60)
Glucose, Bld: 92 mg/dL (ref 70–99)
Potassium: 3.7 mEq/L (ref 3.5–5.1)
Sodium: 137 mEq/L (ref 135–145)
Total Bilirubin: 0.4 mg/dL (ref 0.3–1.2)
Total Protein: 6.7 g/dL (ref 6.0–8.3)

## 2011-07-13 LAB — DIFFERENTIAL
Basophils Absolute: 0 10*3/uL (ref 0.0–0.1)
Basophils Relative: 0 % (ref 0–1)
Eosinophils Absolute: 0.2 10*3/uL (ref 0.0–0.7)
Eosinophils Relative: 2 % (ref 0–5)
Lymphocytes Relative: 28 % (ref 12–46)
Lymphs Abs: 2.3 10*3/uL (ref 0.7–4.0)
Monocytes Absolute: 0.4 10*3/uL (ref 0.1–1.0)
Monocytes Relative: 5 % (ref 3–12)
Neutro Abs: 5.3 10*3/uL (ref 1.7–7.7)
Neutrophils Relative %: 64 % (ref 43–77)

## 2011-07-13 LAB — CBC
HCT: 33.1 % — ABNORMAL LOW (ref 36.0–46.0)
Hemoglobin: 10.6 g/dL — ABNORMAL LOW (ref 12.0–15.0)
MCHC: 32.1 g/dL (ref 30.0–36.0)
MCV: 87.6 fL (ref 78.0–100.0)
Platelets: 375 10*3/uL (ref 150–400)
RBC: 3.79 MIL/uL — ABNORMAL LOW (ref 3.87–5.11)
RDW: 14.6 % (ref 11.5–15.5)
WBC: 8.3 10*3/uL (ref 4.0–10.5)

## 2011-07-13 LAB — URINALYSIS, ROUTINE W REFLEX MICROSCOPIC
Bilirubin Urine: NEGATIVE
Glucose, UA: NEGATIVE mg/dL
Hgb urine dipstick: NEGATIVE
Ketones, ur: NEGATIVE mg/dL
Nitrite: NEGATIVE
Protein, ur: NEGATIVE mg/dL
Specific Gravity, Urine: 1.01 (ref 1.005–1.030)
Urobilinogen, UA: 0.2 mg/dL (ref 0.0–1.0)
pH: 5.5 (ref 5.0–8.0)

## 2011-07-13 LAB — LIPASE, BLOOD: Lipase: 26 U/L (ref 11–59)

## 2011-07-13 LAB — PREGNANCY, URINE: Preg Test, Ur: NEGATIVE

## 2011-07-16 LAB — DIFFERENTIAL
Basophils Absolute: 0
Eosinophils Relative: 0
Lymphocytes Relative: 7 — ABNORMAL LOW
Lymphs Abs: 0.7
Monocytes Absolute: 0.1
Neutro Abs: 8.7 — ABNORMAL HIGH

## 2011-07-16 LAB — BASIC METABOLIC PANEL
Calcium: 8.5
GFR calc Af Amer: >60
GFR calc non Af Amer: >60
Glucose, Bld: 94
Potassium: 3.8
Sodium: 138

## 2011-07-16 LAB — CBC
HCT: 38.5
Hemoglobin: 12.6
RBC: 4.46
RDW: 15.1
WBC: 9.5

## 2011-07-16 LAB — URINALYSIS, ROUTINE W REFLEX MICROSCOPIC
Bilirubin Urine: NEGATIVE
Glucose, UA: NEGATIVE
Hgb urine dipstick: NEGATIVE
Ketones, ur: NEGATIVE
Nitrite: NEGATIVE
Protein, ur: NEGATIVE
Specific Gravity, Urine: 1.015
Urobilinogen, UA: 0.2
pH: 6.5

## 2011-07-16 LAB — PREGNANCY, URINE: Preg Test, Ur: NEGATIVE

## 2011-11-16 ENCOUNTER — Telehealth: Payer: Self-pay

## 2011-11-16 NOTE — Telephone Encounter (Signed)
States she has the implanon and she has been bleeding for 3 weeks and she is clotting also. [facility] can't see her until Monday and they recommended she call her Dr and get something to stop the bleeding and they will remove the implanon Monday. Advised if she did not hear anything back to go to the urgent care  FYI- Hasn't been here since 01/2010

## 2011-11-16 NOTE — Telephone Encounter (Signed)
I agree with urgent care eval

## 2012-03-19 ENCOUNTER — Encounter: Payer: Self-pay | Admitting: Obstetrics & Gynecology

## 2012-03-19 ENCOUNTER — Ambulatory Visit (INDEPENDENT_AMBULATORY_CARE_PROVIDER_SITE_OTHER): Payer: Self-pay | Admitting: Obstetrics & Gynecology

## 2012-03-19 VITALS — BP 129/92 | HR 89 | Temp 98.5°F | Ht 63.0 in | Wt 171.6 lb

## 2012-03-19 DIAGNOSIS — N920 Excessive and frequent menstruation with regular cycle: Secondary | ICD-10-CM

## 2012-03-19 LAB — CBC
HCT: 36.4 % (ref 36.0–46.0)
Hemoglobin: 12.3 g/dL (ref 12.0–15.0)
MCHC: 33.8 g/dL (ref 30.0–36.0)
MCV: 91.5 fL (ref 78.0–100.0)

## 2012-03-19 NOTE — Progress Notes (Signed)
  Subjective:    Patient ID: Danielle Shah, female    DOB: June 08, 1986, 26 y.o.   MRN: [id number]  HPI  26 yo S AA G0 referred here from [facility] for evaluation of her painful long periods. They can last 9 days and have become more painful since 12/12. She says her pap and STI testing were negative at [facility] 4/13.  Review of Systems     Objective:   Physical Exam  Normal appearing vulva NSS RV, NT, mobile Non-enlarged adnexa      Assessment & Plan:   Menorrhagia and dysmenorrhea- She was recently started on the Nuvaring at [facility] last month. I suspect that the Nuvaring will solve her gyn complaints but I will go ahead and get an ultrasound and CBC.

## 2012-03-25 ENCOUNTER — Ambulatory Visit (HOSPITAL_COMMUNITY): Admission: RE | Admit: 2012-03-25 | Payer: Self-pay | Source: Ambulatory Visit

## 2012-04-23 ENCOUNTER — Ambulatory Visit: Payer: Self-pay | Admitting: Obstetrics & Gynecology

## 2013-08-10 ENCOUNTER — Telehealth: Payer: Self-pay | Admitting: Family Medicine

## 2013-08-10 NOTE — Telephone Encounter (Signed)
Ok to give appt as new ,

## 2013-08-12 NOTE — Telephone Encounter (Signed)
Left message for patient to call back  

## 2013-10-14 ENCOUNTER — Encounter: Payer: Self-pay | Admitting: Family Medicine

## 2013-10-14 ENCOUNTER — Encounter (INDEPENDENT_AMBULATORY_CARE_PROVIDER_SITE_OTHER): Payer: Self-pay

## 2013-10-14 ENCOUNTER — Ambulatory Visit (INDEPENDENT_AMBULATORY_CARE_PROVIDER_SITE_OTHER): Payer: 59 | Admitting: Family Medicine

## 2013-10-14 VITALS — BP 112/80 | HR 97 | Resp 18 | Ht 63.0 in | Wt 173.1 lb

## 2013-10-14 DIAGNOSIS — R5381 Other malaise: Secondary | ICD-10-CM

## 2013-10-14 DIAGNOSIS — E785 Hyperlipidemia, unspecified: Secondary | ICD-10-CM

## 2013-10-14 DIAGNOSIS — E669 Obesity, unspecified: Secondary | ICD-10-CM

## 2013-10-14 DIAGNOSIS — R5383 Other fatigue: Secondary | ICD-10-CM

## 2013-10-14 DIAGNOSIS — J209 Acute bronchitis, unspecified: Secondary | ICD-10-CM

## 2013-10-14 DIAGNOSIS — D508 Other iron deficiency anemias: Secondary | ICD-10-CM

## 2013-10-14 LAB — CBC
HEMATOCRIT: 41.5 % (ref 36.0–46.0)
HEMOGLOBIN: 14.2 g/dL (ref 12.0–15.0)
MCH: 31.6 pg (ref 26.0–34.0)
MCHC: 34.2 g/dL (ref 30.0–36.0)
MCV: 92.4 fL (ref 78.0–100.0)
Platelets: 432 10*3/uL — ABNORMAL HIGH (ref 150–400)
RBC: 4.49 MIL/uL (ref 3.87–5.11)
RDW: 13 % (ref 11.5–15.5)
WBC: 9.7 10*3/uL (ref 4.0–10.5)

## 2013-10-14 MED ORDER — AZITHROMYCIN 250 MG PO TABS: ORAL_TABLET | ORAL | Status: AC

## 2013-10-14 MED ORDER — BENZONATATE 100 MG PO CAPS: 100.0000 mg | ORAL_CAPSULE | Freq: Three times a day (TID) | ORAL | Status: DC | PRN

## 2013-10-14 MED ORDER — AZITHROMYCIN 250 MG PO TABS: ORAL_TABLET | ORAL | Status: DC

## 2013-10-14 NOTE — Patient Instructions (Addendum)
F/u in 4 month, call if you need me before  CBc, lipid, chem 7, tSH , HBA1C today  Phentermine will be prescribed after labs are reviewed, take HALF tab every morning though script says 1 daily   Pls commit to exercise  Daily for 30 mins    Pls commit to regular set meal times, you will get a sample 1500 cal diet sheet  Weight loss goal of 2 to 3 pounds per month  Dark area under nail bed is old dark blood from history provided and exam today, oK to get a pedicure  Darkening of skin may be due to excessive rubbing of skin together, we will continue to watch this  Bronchitis will be treated with z pack and tessalon perles

## 2013-10-14 NOTE — Progress Notes (Signed)
   Subjective:    Patient ID: Danielle Shah, female    DOB: 09/08/1986, 28 y.o.   MRN: [id number]  HPI Re establishing care, now working  in management for [employer] re weight wants to start phentermine which she has used in the past with success. Has been exercising but lifestyle inconsistent, travel on the job and works long hours, however very motivated to losing weight to improve her health 4 day h/o increased chest congestion with cough and yellow sputum, has also had chills, no documented fever. Denies sinus pressure and drainage   Review of Systems See HPI Denies chest pains, palpitations and leg swelling Denies abdominal pain, nausea, vomiting,diarrhea or constipation.   Denies dysuria, frequency, hesitancy or incontinence. Denies joint pain, swelling and limitation in mobility. Denies headaches, seizures, numbness, or tingling. Denies depression, anxiety or insomnia. Denies skin break down or rash.        Objective:   Physical Exam BP 112/80  Pulse 97  Resp 18  Ht 5\' 3"  (1.6 m)  Wt 173 lb 1.3 oz (78.509 kg)  BMI 30.67 kg/m2  SpO2 98%  LMP 09/22/2013 Patient alert and oriented and in no cardiopulmonary distress.  HEENT: No facial asymmetry, EOMI, no sinus tenderness,  oropharynx pink and moist.  Neck supple no adenopathy.  Chest: bilateral basilar crackles, no wheezes, adequate air entry throughout.  CVS: S1, S2 no murmurs, no S3.  ABD: Soft non tender. Bowel sounds normal.  Ext: No edema  MS: Adequate ROM spine, shoulders, hips and knees.  Skin: Intact, no ulcerations or rash noted.  Psych: Good eye contact, normal affect. Memory intact not anxious or depressed appearing.  CNS: CN 2-12 intact, power, tone and sensation normal throughout.        Assessment & Plan:  ACUTE BRONCHITIS Antibiotic and decongestant prescribed.    OBESITY Deteriorated. Patient re-educated about  the importance of commitment to a  minimum of 150 minutes of  exercise per week. The importance of healthy food choices with portion control discussed. Encouraged to start a food diary, count calories and to consider  joining a support group. Sample diet sheets offered. Goals set by the patient for the next several months.     Other and unspecified hyperlipidemia Hyperlipidemia:Low fat diet discussed and encouraged.  No meds at this time  FATIGUE Check forthyroid d/o or diabetes

## 2013-10-15 LAB — BASIC METABOLIC PANEL
BUN: 7 mg/dL (ref 6–23)
CHLORIDE: 104 meq/L (ref 96–112)
CO2: 24 meq/L (ref 19–32)
CREATININE: 0.64 mg/dL (ref 0.50–1.10)
Calcium: 10.2 mg/dL (ref 8.4–10.5)
Glucose, Bld: 83 mg/dL (ref 70–99)
POTASSIUM: 5.1 meq/L (ref 3.5–5.3)
SODIUM: 137 meq/L (ref 135–145)

## 2013-10-15 LAB — LIPID PANEL
Cholesterol: 201 mg/dL — ABNORMAL HIGH (ref 0–200)
HDL: 39 mg/dL — ABNORMAL LOW (ref >39)
LDL CALC: 136 mg/dL — AB (ref 0–99)
TRIGLYCERIDES: 130 mg/dL (ref <150)
Total CHOL/HDL Ratio: 5.2 Ratio
VLDL: 26 mg/dL (ref 0–40)

## 2013-10-15 LAB — TSH: TSH: 1.264 u[IU]/mL (ref 0.350–4.500)

## 2013-10-15 LAB — HEMOGLOBIN A1C
HEMOGLOBIN A1C: 5.4 % (ref <5.7)
Mean Plasma Glucose: 108 mg/dL (ref <117)

## 2013-10-16 ENCOUNTER — Encounter: Payer: Self-pay | Admitting: Family Medicine

## 2013-10-16 ENCOUNTER — Other Ambulatory Visit: Payer: Self-pay | Admitting: Family Medicine

## 2013-10-16 MED ORDER — PHENTERMINE HCL 37.5 MG PO TABS
37.5000 mg | ORAL_TABLET | Freq: Every day | ORAL | Status: DC
Start: 2013-10-16 — End: 2014-02-08

## 2013-11-29 ENCOUNTER — Encounter: Payer: Self-pay | Admitting: Family Medicine

## 2013-11-29 DIAGNOSIS — E785 Hyperlipidemia, unspecified: Secondary | ICD-10-CM | POA: Insufficient documentation

## 2013-11-29 NOTE — Assessment & Plan Note (Signed)
Antibiotic and decongestant prescribed 

## 2013-11-29 NOTE — Assessment & Plan Note (Signed)
Hyperlipidemia:Low fat diet discussed and encouraged.  No meds at this time 

## 2013-11-29 NOTE — Assessment & Plan Note (Signed)
Deteriorated. Patient re-educated about  the importance of commitment to a  minimum of 150 minutes of exercise per week. The importance of healthy food choices with portion control discussed. Encouraged to start a food diary, count calories and to consider  joining a support group. Sample diet sheets offered. Goals set by the patient for the next several months.    

## 2013-11-29 NOTE — Assessment & Plan Note (Signed)
Check forthyroid d/o or diabetes

## 2014-02-08 ENCOUNTER — Encounter: Payer: Self-pay | Admitting: Family Medicine

## 2014-02-08 ENCOUNTER — Ambulatory Visit (INDEPENDENT_AMBULATORY_CARE_PROVIDER_SITE_OTHER): Payer: 59 | Admitting: Family Medicine

## 2014-02-08 VITALS — BP 128/84 | HR 100 | Resp 18 | Ht 63.0 in | Wt 162.0 lb

## 2014-02-08 DIAGNOSIS — E669 Obesity, unspecified: Secondary | ICD-10-CM

## 2014-02-08 DIAGNOSIS — E785 Hyperlipidemia, unspecified: Secondary | ICD-10-CM

## 2014-02-08 MED ORDER — PHENTERMINE HCL 37.5 MG PO TABS: 37.5000 mg | ORAL_TABLET | Freq: Every day | ORAL | Status: DC

## 2014-02-08 MED ORDER — ETONOGESTREL-ETHINYL ESTRADIOL 0.12-0.015 MG/24HR VA RING: VAGINAL_RING | VAGINAL | Status: DC

## 2014-02-08 NOTE — Patient Instructions (Signed)
F/u early October.  Continue phentermine half daily and commitment to regular exercise for at least 30 mins to 45 minutes daily   Weight loss goal of  3 pounds per month will get you to your ideal body weight by the endo of this year.  You have done extremely well, eating habits are great, may need to increase exercise time after a while  Fasting lipid in 4 month, remember egg yolks are not healthy for you

## 2014-02-10 ENCOUNTER — Ambulatory Visit: Payer: 59 | Admitting: Family Medicine

## 2014-04-10 NOTE — Assessment & Plan Note (Signed)
Hyperlipidemia:Low fat diet discussed and encouraged.   

## 2014-04-10 NOTE — Assessment & Plan Note (Signed)
Improved. Pt applauded on succesful weight loss through lifestyle change, and encouraged to continue same. Weight loss goal set for the next several months.  

## 2014-04-10 NOTE — Progress Notes (Signed)
   Subjective:    Patient ID: Danielle Shah, female    DOB: [birth year], 28 y.o.   MRN: [id number]  HPI The PT is here for follow up and re-evaluation of chronic medical conditions, medication management and review of any available recent lab and radiology data.  Preventive health is updated, specifically  Cancer screening and Immunization.   The PT denies any adverse reactions to current medications since the last visit.  There are no new concerns.  There are no specific complaints  She has committed to lifestyle change necessary with excellent results      Review of Systems See HPI   Denies recent fever or chills. Denies sinus pressure, nasal congestion, ear pain or sore throat. Denies chest congestion, productive cough or wheezing. Denies chest pains, palpitations and leg swelling Denies abdominal pain, nausea, vomiting,diarrhea or constipation.   Denies dysuria, frequency, hesitancy or incontinence. Denies joint pain, swelling and limitation in mobility. Denies headaches, seizures, numbness, or tingling. Denies depression, anxiety or insomnia. Denies skin break down or rash.           Objective:   Physical Exam BP 128/84  Pulse 100  Resp 18  Ht 5\' 3"  (1.6 m)  Wt 162 lb (73.483 kg)  BMI 28.70 kg/m2  SpO2 97%  LMP 01/06/2014 Patient alert and oriented and in no cardiopulmonary distress.  HEENT: No facial asymmetry, EOMI,   oropharynx pink and moist.  Neck supple no JVD, no mass.  Chest: Clear to auscultation bilaterally.  CVS: S1, S2 no murmurs, no S3.  ABD: Soft non tender.   Ext: No edema  MS: Adequate ROM spine, shoulders, hips and knees.  Skin: Intact, no ulcerations or rash noted.  Psych: Good eye contact, normal affect. Memory intact not anxious or depressed appearing.  CNS: CN 2-12 intact, power,  normal throughout.no focal deficits noted.        Assessment & Plan:  Other and unspecified hyperlipidemia Hyperlipidemia:Low fat diet  discussed and encouraged.    OBESITY Improved. Pt applauded on succesful weight loss through lifestyle change, and encouraged to continue same. Weight loss goal set for the next several months.

## 2014-07-09 ENCOUNTER — Telehealth: Payer: Self-pay | Admitting: *Deleted

## 2014-07-09 NOTE — Telephone Encounter (Signed)
Pt called to cancel her appt for Monday, pt states she moved to [place]. Pt is requesting a new RX for fitermine pt lost the other Rx. Please advise [phone]

## 2014-07-09 NOTE — Telephone Encounter (Signed)
Called patient and left message for them to return call at the office   

## 2014-07-12 ENCOUNTER — Ambulatory Visit: Payer: 59 | Admitting: Family Medicine

## 2014-07-13 NOTE — Telephone Encounter (Signed)
Called patient and left message for them to return call at the office   

## 2014-07-16 ENCOUNTER — Other Ambulatory Visit: Payer: Self-pay

## 2014-07-16 MED ORDER — PHENTERMINE HCL 37.5 MG PO TABS: 37.5000 mg | ORAL_TABLET | Freq: Every day | ORAL | Status: DC

## 2014-07-23 ENCOUNTER — Other Ambulatory Visit: Payer: Self-pay

## 2014-08-09 ENCOUNTER — Encounter: Payer: Self-pay | Admitting: Family Medicine

## 2015-01-19 ENCOUNTER — Telehealth: Payer: Self-pay | Admitting: Family Medicine

## 2015-01-19 NOTE — Telephone Encounter (Signed)
Med refilled x 1

## 2015-03-01 ENCOUNTER — Other Ambulatory Visit: Payer: Self-pay | Admitting: Family Medicine

## 2015-03-24 ENCOUNTER — Other Ambulatory Visit: Payer: Self-pay

## 2015-03-24 ENCOUNTER — Telehealth: Payer: Self-pay | Admitting: *Deleted

## 2015-03-24 ENCOUNTER — Telehealth: Payer: Self-pay

## 2015-03-24 MED ORDER — ETONOGESTREL-ETHINYL ESTRADIOL 0.12-0.015 MG/24HR VA RING: VAGINAL_RING | VAGINAL | Status: DC

## 2015-03-24 MED ORDER — NORGESTIM-ETH ESTRAD TRIPHASIC 0.18/0.215/0.25 MG-25 MCG PO TABS: 1.0000 | ORAL_TABLET | Freq: Every day | ORAL | Status: DC

## 2015-03-24 MED ORDER — MEDROXYPROGESTERONE ACETATE 5 MG PO TABS: 5.0000 mg | ORAL_TABLET | Freq: Two times a day (BID) | ORAL | Status: DC

## 2015-03-24 NOTE — Telephone Encounter (Signed)
Med refilled to last until scheduled appointment

## 2015-03-24 NOTE — Telephone Encounter (Signed)
Pt called requesting her medication to be refilled pt states she is on birth control and she has had her period for 6 days now and it is "god awful heavy" and she needs it called in. Pt also stated she lives in [place] now and [facility] needs [facility] [address] in [place]. Please advise

## 2015-03-24 NOTE — Telephone Encounter (Signed)
Provera 5mg  BID #10 sent to pharmacy.   Orth-tri-lo also sent to replace nuvaring.

## 2015-04-12 ENCOUNTER — Ambulatory Visit (INDEPENDENT_AMBULATORY_CARE_PROVIDER_SITE_OTHER): Payer: 59 | Admitting: Family Medicine

## 2015-04-12 ENCOUNTER — Encounter: Payer: Self-pay | Admitting: Family Medicine

## 2015-04-12 VITALS — BP 118/76 | HR 96 | Resp 18 | Ht 63.0 in | Wt 161.0 lb

## 2015-04-12 DIAGNOSIS — E785 Hyperlipidemia, unspecified: Secondary | ICD-10-CM | POA: Diagnosis not present

## 2015-04-12 DIAGNOSIS — J3089 Other allergic rhinitis: Secondary | ICD-10-CM

## 2015-04-12 DIAGNOSIS — B009 Herpesviral infection, unspecified: Secondary | ICD-10-CM

## 2015-04-12 DIAGNOSIS — Z309 Encounter for contraceptive management, unspecified: Secondary | ICD-10-CM

## 2015-04-12 DIAGNOSIS — R5383 Other fatigue: Secondary | ICD-10-CM

## 2015-04-12 DIAGNOSIS — Z131 Encounter for screening for diabetes mellitus: Secondary | ICD-10-CM | POA: Diagnosis not present

## 2015-04-12 DIAGNOSIS — Z114 Encounter for screening for human immunodeficiency virus [HIV]: Secondary | ICD-10-CM

## 2015-04-12 DIAGNOSIS — E663 Overweight: Secondary | ICD-10-CM

## 2015-04-12 MED ORDER — ETONOGESTREL-ETHINYL ESTRADIOL 0.12-0.015 MG/24HR VA RING: VAGINAL_RING | VAGINAL | Status: DC

## 2015-04-12 MED ORDER — ACYCLOVIR 400 MG PO TABS: 400.0000 mg | ORAL_TABLET | Freq: Three times a day (TID) | ORAL | Status: DC

## 2015-04-12 NOTE — Assessment & Plan Note (Signed)
Intermittent flares,most recent was 1 week ago, will send in  1 course of treatment in the event  Of a recurrence

## 2015-04-12 NOTE — Assessment & Plan Note (Signed)
Script printed for nuva ring x 1 year, pt to call with pharmacy info for faxing

## 2015-04-12 NOTE — Assessment & Plan Note (Signed)
Hyperpigmentation uncder arms and in groin, advised against shaving

## 2015-04-12 NOTE — Patient Instructions (Signed)
F/u in 6 months, call if you need me befpore  Two medications prescribed as discussed  Fasting labs asa[p  Please work on good  health habits so that your health will improve. 1. Commitment to daily physical activity for 30 to 60  minutes, if you are able to do this.  2. Commitment to wise food choices. Aim for half of your  food intake to be vegetable and fruit, one quarter starchy foods, and one quarter protein. Try to eat on a regular schedule  3 meals per day, snacking between meals should be limited to vegetables or fruits or small portions of nuts. 64 ounces of water per day is generally recommended, unless you have specific health conditions, like heart failure or kidney failure where you will need to limit fluid intake.  3. Commitment to sufficient and a  good quality of physical and mental rest daily, generally between 6 to 8 hours per day.  WITH PERSISTANCE AND PERSEVERANCE, THE IMPOSSIBLE , BECOMES THE NORM!  Thanks for choosing [facility], we consider it a privelige to serve you.

## 2015-04-12 NOTE — Assessment & Plan Note (Signed)
Current flare  Due to grass exposure, will use OTC med if needed, generally mild and intermittent, does not want nasal spray

## 2015-04-12 NOTE — Assessment & Plan Note (Signed)
Unchanged. Patient re-educated about  the importance of commitment to a  minimum of 150 minutes of exercise per week.  The importance of healthy food choices with portion control discussed. Encouraged to start a food diary, count calories and to consider  joining a support group. Sample diet sheets offered. Goals set by the patient for the next several months.   Weight /BMI 04/12/2015 02/08/2014 10/14/2013  WEIGHT 161 lb 162 lb 173 lb 1.3 oz  HEIGHT 5\' 3"  5\' 3"  5\' 3"   BMI 28.53 kg/m2 28.7 kg/m2 30.67 kg/m2    Current exercise per week  150 minutes.

## 2015-04-12 NOTE — Assessment & Plan Note (Signed)
Hyperlipidemia:Low fat diet discussed and encouraged.   Lipid Panel  Lab Results  Component Value Date   CHOL 201* 10/14/2013   HDL 39* 10/14/2013   LDLCALC 136* 10/14/2013   TRIG 130 10/14/2013   CHOLHDL 5.2 10/14/2013   Updated lab needed at/ before next visit.

## 2015-04-12 NOTE — Progress Notes (Signed)
Danielle Shah     MRN: [id number]      DOB: March 19, 1986   HPI Ms. Arscott is here for follow up and re-evaluation of chronic medical conditions, medication management and review of any available recent lab and radiology data.  Preventive health is updated, specifically  Cancer screening and Immunization.   Questions or concerns regarding consultations or procedures which the PT has had in the interim are  addressed. The PT wants to resume the nuva ring contraceptive device, she had problems bleeding after being on contraception for 7 years, now taking the pill, will insert a nuva ring on day one of next cycle  Has dark areas under arms and in the bikini line where she is shaving worried about being diabetic. Has gained weight and will work on changing her diet to attain a healthier weight Walks a lot on the job, works 16 hour days, and has no exercise commitment outside of work, she is encouraged to do so.  ROS Denies recent fever or chills. 2 day h/o sinus pressure and  nasal congestion, denies ear pain or sore throat.Feels as though cutting the grass was the trigger Denies chest congestion, productive cough or wheezing. Denies chest pains, palpitations and leg swelling Denies abdominal pain, nausea, vomiting,diarrhea or constipation.   Denies dysuria, frequency, hesitancy or incontinence. Denies joint pain, swelling and limitation in mobility. Denies headaches, seizures, numbness, or tingling. Denies depression, anxiety or insomnia.    PE  BP 118/76 mmHg  Pulse 96  Resp 18  Ht 5\' 3"  (1.6 m)  Wt 161 lb (73.029 kg)  BMI 28.53 kg/m2  SpO2 98%  Patient alert and oriented and in no cardiopulmonary distress.  HEENT: No facial asymmetry, EOMI,   oropharynx pink and moist.  Neck supple no JVD, no mass.  Chest: Clear to auscultation bilaterally.  CVS: S1, S2 no murmurs, no S3.Regular rate.    Ext: No edema  MS: Adequate ROM spine, shoulders, hips and knees.  Skin: Intact, no  ulcerations or rash noted.  Psych: Good eye contact, normal affect. Memory intact not anxious or depressed appearing.  CNS: CN 2-12 intact, power,  normal throughout.no focal deficits noted.   Assessment & Plan   Allergic rhinitis Current flare  Due to grass exposure, will use OTC med if needed, generally mild and intermittent, does not want nasal spray  Overweight (BMI 25.0-29.9) Unchanged. Patient re-educated about  the importance of commitment to a  minimum of 150 minutes of exercise per week.  The importance of healthy food choices with portion control discussed. Encouraged to start a food diary, count calories and to consider  joining a support group. Sample diet sheets offered. Goals set by the patient for the next several months.   Weight /BMI 04/12/2015 02/08/2014 10/14/2013  WEIGHT 161 lb 162 lb 173 lb 1.3 oz  HEIGHT 5\' 3"  5\' 3"  5\' 3"   BMI 28.53 kg/m2 28.7 kg/m2 30.67 kg/m2    Current exercise per week  150 minutes.   Herpes simplex virus (HSV) infection Intermittent flares,most recent was 1 week ago, will send in  1 course of treatment in the event  Of a recurrence  DERMATITIS, ATOPIC Hyperpigmentation uncder arms and in groin, advised against shaving  Hyperlipidemia, mild Hyperlipidemia:Low fat diet discussed and encouraged.   Lipid Panel  Lab Results  Component Value Date   CHOL 201* 10/14/2013   HDL 39* 10/14/2013   LDLCALC 136* 10/14/2013   TRIG 130 10/14/2013   CHOLHDL 5.2 10/14/2013  Updated lab needed at/ before next visit.      Contraception Surveyor, mining for nuva ring x 1 year, pt to call with pharmacy info for faxing

## 2015-04-20 ENCOUNTER — Telehealth: Payer: Self-pay | Admitting: *Deleted

## 2015-04-20 NOTE — Telephone Encounter (Signed)
Pt called stating in the future all of her rx will go to [facility] pharmacy but now she needs the nuva ring to be sent there, the fax number to [facility] pharmacy is [phone]. Please advise

## 2015-04-20 NOTE — Telephone Encounter (Signed)
Med sent.

## 2015-04-24 ENCOUNTER — Telehealth: Payer: Self-pay | Admitting: Family Medicine

## 2015-04-24 MED ORDER — PROMETHAZINE HCL 12.5 MG RE SUPP: 12.5000 mg | Freq: Four times a day (QID) | RECTAL | Status: DC | PRN

## 2015-04-24 NOTE — Telephone Encounter (Signed)
Called in stating [facility], treated with z pack which she started today, following this has had recurrent vomiting, denies di, is PCN allergic. Will send in phenergan suppos, and I have advised that if vomiting continues , she will need to go to ED for hydration, already c/o weakness and light headedness when she tries to d sit up, has some one who will collect the med for her

## 2015-06-29 ENCOUNTER — Other Ambulatory Visit: Payer: Self-pay

## 2015-06-29 DIAGNOSIS — B009 Herpesviral infection, unspecified: Secondary | ICD-10-CM

## 2015-10-17 ENCOUNTER — Ambulatory Visit: Payer: 59 | Admitting: Family Medicine

## 2016-04-05 ENCOUNTER — Other Ambulatory Visit: Payer: Self-pay | Admitting: Family Medicine

## 2016-09-27 ENCOUNTER — Other Ambulatory Visit: Payer: Self-pay | Admitting: Family Medicine

## 2016-09-27 ENCOUNTER — Telehealth: Payer: Self-pay

## 2016-09-27 MED ORDER — CIPROFLOXACIN HCL 500 MG PO TABS: 500.0000 mg | ORAL_TABLET | Freq: Two times a day (BID) | ORAL | 0 refills | Status: DC

## 2016-09-27 NOTE — Telephone Encounter (Signed)
Let her know 3 day supply of cipro sent to [facility] in [place], may need to change the pharmacy, needs to be NOT pregnant to take the cipro, if in doubt then will need to go to [facility]

## 2016-09-27 NOTE — Telephone Encounter (Signed)
Patient aware.

## 2017-12-02 ENCOUNTER — Telehealth: Payer: Self-pay | Admitting: Family Medicine

## 2017-12-02 NOTE — Telephone Encounter (Signed)
I think the cut off is 3 years. Ok to schedule appt

## 2017-12-02 NOTE — Telephone Encounter (Signed)
Left voicemail for patient to call back & schedule.

## 2017-12-02 NOTE — Telephone Encounter (Signed)
Called to schedule appt, no answer, left voicemail.

## 2017-12-02 NOTE — Telephone Encounter (Signed)
Patient calling in to schedule an appt & refill medcations, she has not been in the office since 04/12/2015.

## 2017-12-10 ENCOUNTER — Encounter: Payer: Self-pay | Admitting: Family Medicine

## 2017-12-10 ENCOUNTER — Ambulatory Visit (INDEPENDENT_AMBULATORY_CARE_PROVIDER_SITE_OTHER): Payer: Self-pay | Admitting: Family Medicine

## 2017-12-10 VITALS — BP 130/88 | HR 91 | Resp 16 | Ht 63.0 in | Wt 172.0 lb

## 2017-12-10 DIAGNOSIS — E785 Hyperlipidemia, unspecified: Secondary | ICD-10-CM

## 2017-12-10 DIAGNOSIS — F322 Major depressive disorder, single episode, severe without psychotic features: Secondary | ICD-10-CM

## 2017-12-10 DIAGNOSIS — F32A Depression, unspecified: Secondary | ICD-10-CM

## 2017-12-10 DIAGNOSIS — E663 Overweight: Secondary | ICD-10-CM

## 2017-12-10 DIAGNOSIS — F329 Major depressive disorder, single episode, unspecified: Secondary | ICD-10-CM

## 2017-12-10 DIAGNOSIS — F419 Anxiety disorder, unspecified: Secondary | ICD-10-CM

## 2017-12-10 MED ORDER — CITALOPRAM HYDROBROMIDE 10 MG PO TABS: 10.0000 mg | ORAL_TABLET | Freq: Every day | ORAL | 3 refills | Status: DC

## 2017-12-10 NOTE — Patient Instructions (Signed)
F/u in 6 to 8 weeks  TSH  Please  Start citalopram 10 mg one daily for depression and anxiety, this will help you a lot!

## 2017-12-14 ENCOUNTER — Encounter: Payer: Self-pay | Admitting: Family Medicine

## 2017-12-14 DIAGNOSIS — F322 Major depressive disorder, single episode, severe without psychotic features: Secondary | ICD-10-CM | POA: Insufficient documentation

## 2017-12-14 DIAGNOSIS — F32A Depression, unspecified: Secondary | ICD-10-CM

## 2017-12-14 DIAGNOSIS — F329 Major depressive disorder, single episode, unspecified: Secondary | ICD-10-CM | POA: Insufficient documentation

## 2017-12-14 DIAGNOSIS — F419 Anxiety disorder, unspecified: Secondary | ICD-10-CM | POA: Insufficient documentation

## 2017-12-14 HISTORY — DX: Depression, unspecified: F32.A

## 2017-12-14 NOTE — Assessment & Plan Note (Signed)
Deteriorated. Patient re-educated about  the importance of commitment to a  minimum of 150 minutes of exercise per week.  The importance of healthy food choices with portion control discussed. Encouraged to start a food diary, count calories and to consider  joining a support group. Sample diet sheets offered. Goals set by the patient for the next several months.   Weight /BMI 12/10/2017 04/12/2015 02/08/2014  WEIGHT 172 lb 161 lb 162 lb  HEIGHT 5\' 3"  5\' 3"  5\' 3"   BMI 30.47 kg/m2 28.53 kg/m2 28.7 kg/m2  check TSH to ensure normal function in light of increased anxiety

## 2017-12-14 NOTE — Assessment & Plan Note (Addendum)
Overwhelmed with life stresses, repeatedly failed exam for OT, has suffered $40,000 cut in pay, working 3 jobs to supplement and save exam funding Start SSRI and needs therapy, currently uninsured and no extra time, will call and ask if interested in telepsych , which is currently free and which she would benefit greatly from. Attempted to call, no answer, will send an electronic message

## 2017-12-14 NOTE — Progress Notes (Signed)
   Danielle Shah     MRN: [id number]      DOB: [birth year]   HPI Danielle Shah is herewith a main c/o weight gain and feeling stressed, overwhelmed and "a failure" Having difficulty with exams and in financial crisis Initially requested appetite suppressant , but as she opened up, and started crying and talking about her life, she is very willing to defer to better mental and emotional health She denies suicidal or homicidal ideation and has the support of her boyfriend No regular exercise, no time, 3 jobs, poor eating habits, cheap food at bedtime  ROS Denies recent fever or chills. Denies sinus pressure, nasal congestion, ear pain or sore throat. Denies chest congestion, productive cough or wheezing. Denies chest pains, palpitations and leg swelling Denies abdominal pain, nausea, vomiting,diarrhea or constipation.   Denies dysuria, frequency, hesitancy or incontinence. Denies joint pain, swelling and limitation in mobility.  Denies skin break down or rash.   PE  BP 130/88   Pulse 91   Resp 16   Ht 5\' 3"  (1.6 m)   Wt 172 lb (78 kg)   SpO2 96%   BMI 30.47 kg/m   Patient alert, crying and anxious, oriented and in no cardiopulmonary distress.  HEENT: No facial asymmetry, EOMI,   oropharynx pink and moist.  Neck supple no JVD, no mass.  Chest: Clear to auscultation bilaterally.  CVS: S1, S2 no murmurs, no S3.Regular rate.  ABD: Soft non tender.   Ext: No edema  MS: Adequate ROM spine, shoulders, hips and knees.  Skin: Intact, no ulcerations or rash noted.  Psych: Good eye contact, anxious and depressed appearing.  CNS: CN 2-12 intact, power,  normal throughout.no focal deficits noted.   Assessment & Plan  Anxiety and depression Start lexapro 10 mg daily, not suicidal or homicidal. Overwhelmed with "life"    Depression, major, single episode, severe ([place]) Overwhelmed with life stresses, repeatedly failed exam for OT, has suffered $40,000 cut in pay, working 3 jobs  to supplement and save exam funding Start SSRI and needs therapy, currently uninsured and no extra time, will call and ask if interested in telepsych , which is currently free and which she would benefit greatly from. Attempted to call, no answer, will send an electronic message  Overweight (BMI 25.0-29.9) Deteriorated. Patient re-educated about  the importance of commitment to a  minimum of 150 minutes of exercise per week.  The importance of healthy food choices with portion control discussed. Encouraged to start a food diary, count calories and to consider  joining a support group. Sample diet sheets offered. Goals set by the patient for the next several months.   Weight /BMI 12/10/2017 04/12/2015 02/08/2014  WEIGHT 172 lb 161 lb 162 lb  HEIGHT 5\' 3"  5\' 3"  5\' 3"   BMI 30.47 kg/m2 28.53 kg/m2 28.7 kg/m2  check TSH to ensure normal function in light of increased anxiety

## 2017-12-14 NOTE — Assessment & Plan Note (Signed)
Start lexapro 10 mg daily, not suicidal or homicidal. Overwhelmed with "life"

## 2017-12-16 ENCOUNTER — Telehealth (HOSPITAL_COMMUNITY): Payer: Self-pay

## 2017-12-16 NOTE — Telephone Encounter (Signed)
Writer received [facility] referral requesting that I contact her next week.

## 2017-12-31 ENCOUNTER — Telehealth (HOSPITAL_COMMUNITY): Payer: Self-pay

## 2017-12-31 NOTE — Telephone Encounter (Signed)
[facility] - left message.  

## 2018-01-02 ENCOUNTER — Encounter: Payer: Self-pay | Admitting: Clinical

## 2018-01-20 ENCOUNTER — Telehealth: Payer: Self-pay

## 2018-01-20 DIAGNOSIS — F32A Depression, unspecified: Secondary | ICD-10-CM

## 2018-01-20 DIAGNOSIS — F419 Anxiety disorder, unspecified: Principal | ICD-10-CM

## 2018-01-20 DIAGNOSIS — F329 Major depressive disorder, single episode, unspecified: Secondary | ICD-10-CM

## 2018-01-20 NOTE — BH Specialist Note (Signed)
[place] Initial Clinical Assessment  MRN: [id number] NAME: Danielle Shah Date: 01/20/18   Total time: 30 minutes  Type of Contact: Type of Contact: Phone Call Initial Contact Patient consent obtained: Patient consent obtained for Virtual Visit: (NA) Reason for Visit today: Reason for Your Call/Visit Today: [facility] Initial Assessment   Treatment History Patient recently received Inpatient Treatment: Have You Recently Been in Any Inpatient Treatment (Hospital/Detox/Crisis Center/28-Day Program)?: No  Facility/Program:    Date of discharge:   Patient currently being seen by therapist/psychiatrist: Do You Currently Have a Therapist/Psychiatrist?: No Patient currently receiving the following services: Patient Currently Receiving the Following Services:: (NA)  Past Psychiatric History/Hospitalization(s): Anxiety: Yes Bipolar Disorder: No Depression: Yes Mania: No Psychosis: No Schizophrenia: No Personality Disorder: No Hospitalization for psychiatric illness: No History of Electroconvulsive Shock Therapy: No Prior Suicide Attempts: No  Decreased need for sleep: No  Euphoria: No Family History of mental illness: No Family History of substance abuse: No  Substance Abuse: No  DUI: No  Insomnia: No  History of violence No  Physical, sexual or emotional abuse:No  Prior outpatient mental health therapy: Yes 2011-unable to remember the name of the facility.    Clinical Assessment:  PHQ-9 Assessments: Depression screen [facility] 2/9 01/20/2018 12/10/2017 12/10/2017 10/14/2013  Decreased Interest 2 2 0 0  Down, Depressed, Hopeless 2 2 0 0  PHQ - 2 Score 4 4 0 0  Altered sleeping 2 3 - 2  Tired, decreased energy 2 3 - 3  Change in appetite 0 3 - 0  Feeling bad or failure about yourself  1 2 - 0  Trouble concentrating 1 3 - 0  Moving slowly or fidgety/restless 0 3 - 0  Suicidal thoughts 0 0 - 0  PHQ-9 Score 10 21 - 5  Difficult doing work/chores Somewhat difficult - - -      GAD-7 Assessments: GAD 7 : Generalized Anxiety Score 01/20/2018 12/10/2017  Nervous, Anxious, on Edge 1 3  Control/stop worrying 1 3  Worry too much - different things 2 3  Trouble relaxing 2 3  Restless 2 3  Easily annoyed or irritable 0 3  Afraid - awful might happen 0 3  Total GAD 7 Score 8 21     Social Functioning Social maturity: Social Maturity: Impulsive Social judgement: Social Judgement: Normal  Stress Current stressors: Current Stressors: Other (Comment), Finances, Grief/losses(2018 Grandfather died 2 weeks ago; and works two jobs) Familial stressors: Familial Stressors: None Sleep: Sleep: No problems Appetite: Appetite: No problems Coping ability: Coping ability: Normal Patient taking medications as prescribed: Patient taking medications as prescribed: Yes  Current medications:  Outpatient Encounter Medications as of 01/20/2018  Medication Sig  . citalopram (CELEXA) 10 MG tablet Take 1 tablet (10 mg total) by mouth daily.  Marland Kitchen etonogestrel-ethinyl estradiol (NUVARING) 0.12-0.015 MG/24HR vaginal ring INSERT 1 RING VAGINALLY AND LEAVE IN PLACE FOR 3 CONSECUTIVE WK THEN REMOVE FOR 1 WK   No facility-administered encounter medications on file as of 01/20/2018.     Self-harm Behaviors Risk Assessment Self-harm risk factors: Self-harm risk factors: Acts of self-harm Patient endorses recent thoughts of harming self: Have you recently had any thoughts about harming yourself?: No    Danger to Others Risk Assessment Danger to others risk factors: Danger to Others Risk Factors: No risk factors noted Patient endorses recent thoughts of harming others: Notification required: No need or identified person    Goals, Interventions and Follow-up Plan Goals: Increase healthy adjustment to current life  circumstances, Increase adequate support systems for patient/family and Begin healthy grieving over loss Interventions: Motivational Interviewing and Supportive  Counseling Follow-up Plan: [facility] Phone Follow Up   Summary of Clinical Assessment Summary:   Patient is a 32 year old female that reports depression since the death of her grandfather two weeks ago.  Patient reports that she stays busy since she has to work three jobs.   Patient reports that she has been taking her psychiatric medication for the past 5 weeks without any negative side effects.   Patient reports that she does not have any children, she lives alone and has one dog.    Danielle Shah, LCAS-A

## 2018-01-22 ENCOUNTER — Telehealth: Payer: Self-pay

## 2018-01-22 DIAGNOSIS — F32A Depression, unspecified: Secondary | ICD-10-CM

## 2018-01-22 DIAGNOSIS — F329 Major depressive disorder, single episode, unspecified: Secondary | ICD-10-CM

## 2018-01-22 DIAGNOSIS — F419 Anxiety disorder, unspecified: Principal | ICD-10-CM

## 2018-01-22 NOTE — BH Specialist Note (Signed)
Virtual Behavioral Health Treatment Plan Team Note  MRN: [id number] NAME: Danielle Shah  DATE: 01/22/18   Total time: 15 minutes Total number of Virtual [place] Treatment Team Plan encounters: 1/4  Treatment Team Attendees: Graciella Freer and Dr. Dwyane Dee   Screenings PHQ-9 Assessments:  Depression screen [facility] 2/9 01/20/2018 12/10/2017 12/10/2017  Decreased Interest 2 2 0  Down, Depressed, Hopeless 2 2 0  PHQ - 2 Score 4 4 0  Altered sleeping 2 3 -  Tired, decreased energy 2 3 -  Change in appetite 0 3 -  Feeling bad or failure about yourself  1 2 -  Trouble concentrating 1 3 -  Moving slowly or fidgety/restless 0 3 -  Suicidal thoughts 0 0 -  PHQ-9 Score 10 21 -  Difficult doing work/chores Somewhat difficult - -   GAD-7 Assessments:  GAD 7 : Generalized Anxiety Score 01/20/2018 12/10/2017  Nervous, Anxious, on Edge 1 3  Control/stop worrying 1 3  Worry too much - different things 2 3  Trouble relaxing 2 3  Restless 2 3  Easily annoyed or irritable 0 3  Afraid - awful might happen 0 3  Total GAD 7 Score 8 21    Presenting Problem/Current Symptoms:   Depression since the death of her grandfather two weeks ago.  Patient reports that she stays busy since she has to work three jobs.    Diagnoses:    ICD-10-CM   1. Anxiety and depression F41.9    F32.9     Psychiatric History  Past Psychiatric History/Hospitalization(s): Anxiety: Yes Bipolar Disorder: No Depression: No Mania: No Psychosis: No Schizophrenia: No Personality Disorder: No Hospitalization for psychiatric illness: No History of Electroconvulsive Shock Therapy: No Prior Suicide Attempts: No   Stress   Virtual BH Phone Follow Up from 01/20/2018 in [facility]  Current Stressors  Other (Comment), Finances, Grief/losses [phone] Grandfather died 2 weeks ago,  and works two jobs]  Familial Stressors  None  Sleep  No problems  Appetite  No problems  Coping ability  Normal  Patient taking medications as  prescribed  Yes        Self-harm Behaviors Risk Assessment   Virtual [place] Phone Follow Up from 01/20/2018 in [facility]  Self-harm risk factors  Acts of self-harm  Have you recently had any thoughts about harming yourself?  No       Allergies:  Allergies as of 01/22/2018 - Review Complete 12/14/2017  Allergen Reaction Noted  . Penicillins  04/23/2008   Medication History Current medications:  Outpatient Encounter Medications as of 01/22/2018  Medication Sig  . citalopram (CELEXA) 10 MG tablet Take 1 tablet (10 mg total) by mouth daily.  Marland Kitchen etonogestrel-ethinyl estradiol (NUVARING) 0.12-0.015 MG/24HR vaginal ring INSERT 1 RING VAGINALLY AND LEAVE IN PLACE FOR 3 CONSECUTIVE WK THEN REMOVE FOR 1 WK   No facility-administered encounter medications on file as of 01/22/2018.     Psychotropic Medication Management: No changes   Medication Management Recommendations: No changes   Goals, Interventions and Follow-up Plan Goals: Increase healthy adjustment to current life circumstances Increase adequate support systems for patient/family Begin healthy grieving over loss Interventions: Motivational Interviewing Supportive Counseling Follow-up Plan: [facility] Phone Follow Up   Scribe for Treatment Team: Rene Paci, LCAS-A

## 2018-01-29 ENCOUNTER — Ambulatory Visit (INDEPENDENT_AMBULATORY_CARE_PROVIDER_SITE_OTHER): Payer: Self-pay | Admitting: Family Medicine

## 2018-01-29 ENCOUNTER — Encounter: Payer: Self-pay | Admitting: Family Medicine

## 2018-01-29 VITALS — BP 116/80 | HR 99 | Resp 16 | Ht 63.0 in | Wt 175.0 lb

## 2018-01-29 DIAGNOSIS — F909 Attention-deficit hyperactivity disorder, unspecified type: Secondary | ICD-10-CM

## 2018-01-29 DIAGNOSIS — F322 Major depressive disorder, single episode, severe without psychotic features: Secondary | ICD-10-CM

## 2018-01-29 DIAGNOSIS — E669 Obesity, unspecified: Secondary | ICD-10-CM

## 2018-01-29 MED ORDER — ATOMOXETINE HCL 10 MG PO CAPS: 10.0000 mg | ORAL_CAPSULE | Freq: Every day | ORAL | 3 refills | Status: DC

## 2018-01-29 MED ORDER — ATOMOXETINE HCL 10 MG PO CAPS: 10.0000 mg | ORAL_CAPSULE | Freq: Every day | ORAL | 0 refills | Status: DC

## 2018-01-29 NOTE — Patient Instructions (Addendum)
F/u in 6 months, call if you need me sooner  Please start once daily strattera sent in and continue citalopram as before   Get [facility] lab test today  Glad you feel better   It is important that you exercise regularly at least 30 minutes 5 times a week. If you develop chest pain, have severe difficulty breathing, or feel very tired, stop exercising immediately and seek medical attention   All the best !!!

## 2018-01-30 ENCOUNTER — Telehealth: Payer: Self-pay | Admitting: Family Medicine

## 2018-01-30 NOTE — Telephone Encounter (Signed)
Patient called in to let you know her rx for strattera was over $400, she is requesting something else that isnt as expensive. Cb#: [phone]   Patient also asked for help setting up mychart, I attempted to give her the mychart help line but she said she didn't want to do it if she had to call someone else.

## 2018-01-30 NOTE — Telephone Encounter (Signed)
Please advise. Thank you

## 2018-01-31 ENCOUNTER — Encounter: Payer: Self-pay | Admitting: Family Medicine

## 2018-01-31 ENCOUNTER — Other Ambulatory Visit: Payer: Self-pay | Admitting: Family Medicine

## 2018-01-31 DIAGNOSIS — F909 Attention-deficit hyperactivity disorder, unspecified type: Secondary | ICD-10-CM | POA: Insufficient documentation

## 2018-01-31 MED ORDER — AMPHETAMINE-DEXTROAMPHET ER 20 MG PO CP24: 20.0000 mg | ORAL_CAPSULE | ORAL | 0 refills | Status: DC

## 2018-01-31 NOTE — Assessment & Plan Note (Signed)
Excellent response to fluoxetine and therapy continue both and  Try to increase exercise commitment as able

## 2018-01-31 NOTE — Telephone Encounter (Signed)
I sent in 2 months ( one month at a time) of adderal, that is her only other option, please let her know, I hope it  Is more affordale, her exam is in the next approximaely 4 to 6 weeks.   I have removed Strattera, she

## 2018-01-31 NOTE — Assessment & Plan Note (Signed)
Deteriorated. Patient re-educated about  the importance of commitment to a  minimum of 150 minutes of exercise per week.  The importance of healthy food choices with portion control discussed. Encouraged to start a food diary, count calories and to consider  joining a support group. Sample diet sheets offered. Goals set by the patient for the next several months.   Weight /BMI 01/29/2018 12/10/2017 04/12/2015  WEIGHT 175 lb 172 lb 161 lb  HEIGHT 5\' 3"  5\' 3"  5\' 3"   BMI 31 kg/m2 30.47 kg/m2 28.53 kg/m2

## 2018-01-31 NOTE — Progress Notes (Signed)
   Danielle Shah     MRN: [id number]      DOB: 07/30/1986   HPI [name] is here for follow up and re-evaluation of chronic medical conditions and medication management There are no specific complaints  She is concerned about her lack of weight loss. She reports improvement in her feeling of being overwhelmed and depression. She is taking her medication and has had 2 teletherapy sesssion She has started exercising 2 times weekly when se is able, she is in the process of moving and her exam is upcoming, she does feel better about it. She does have ADHD and was on medication while in school nd will benefit from this, so she will try to resume same, she is retested at visit and will definitely benefit ROS Denies recent fever or chills. Denies sinus pressure, nasal congestion, ear pain or sore throat. Denies chest congestion, productive cough or wheezing. Denies chest pains, palpitations and leg swelling Denies abdominal pain, nausea, vomiting,diarrhea or constipation.   Denies dysuria, frequency, hesitancy or incontinence. Denies joint pain, swelling and limitation in mobility. Denies headaches, seizures, numbness, or tingling. Denies uncontrolled  depression, anxiety or insomnia. Denies skin break down or rash.   PE  BP 116/80   Pulse 99   Resp 16   Ht 5\' 3"  (1.6 m)   Wt 175 lb (79.4 kg)   SpO2 98%   BMI 31.00 kg/m   Patient alert and oriented and in no cardiopulmonary distress.  HEENT: No facial asymmetry, EOMI,   oropharynx pink and moist.  Neck supple no JVD, no mass.  Chest: Clear to auscultation bilaterally.  CVS: S1, S2 no murmurs, no S3.Regular rate.  ABD: Soft non tender.   Ext: No edema  MS: Adequate ROM spine, shoulders, hips and knees.  Skin: Intact, no ulcerations or rash noted.  Psych: Good eye contact, normal affect. Memory intact not anxious or depressed appearing.  CNS: CN 2-12 intact, power,  normal throughout.no focal deficits noted.   Assessment  & Plan  Depression, major, single episode, severe (HCC) Excellent response to fluoxetine and therapy continue both and  Try to increase exercise commitment as able  ADHD (attention deficit hyperactivity disorder) Patient to resume  Strattera which she has used in the past, but too expensive , so aderall prescribed instead  Obesity (BMI 30.0-34.9) Deteriorated. Patient re-educated about  the importance of commitment to a  minimum of 150 minutes of exercise per week.  The importance of healthy food choices with portion control discussed. Encouraged to start a food diary, count calories and to consider  joining a support group. Sample diet sheets offered. Goals set by the patient for the next several months.   Weight /BMI 01/29/2018 12/10/2017 04/12/2015  WEIGHT 175 lb 172 lb 161 lb  HEIGHT 5\' 3"  5\' 3"  5\' 3"   BMI 31 kg/m2 30.47 kg/m2 28.53 kg/m2

## 2018-01-31 NOTE — Assessment & Plan Note (Signed)
Patient to resume  Strattera which she has used in the past, but too expensive , so aderall prescribed instead

## 2018-01-31 NOTE — Telephone Encounter (Signed)
Called patient to let her know of Dr.Simpson's recommendations and about new prescription being sent in. No answer and vm is full and I'm unable to leave message. Will try again later.

## 2018-02-06 NOTE — Telephone Encounter (Signed)
Called patient to advise of Dr.Simpson's recommendations, no answer.

## 2018-02-07 NOTE — Telephone Encounter (Signed)
Called patient to speak to her regarding new prescription Dr.Simpson called in due to cost of first prescription. No answer and vm is full. This is the 3rd attempt to contact patient.

## 2018-03-14 ENCOUNTER — Telehealth: Payer: Self-pay | Admitting: Clinical

## 2018-03-14 NOTE — Telephone Encounter (Signed)
This [facility] Clinician was present during [facility] intern's phone call.  Jasmine P. Jimmye Norman, MSW, [place] Clinician

## 2018-03-14 NOTE — Telephone Encounter (Signed)
[place] Follow Up Assessment  MRN: [id number] NAME: GWYNETH FERNANDEZ Date: 03/14/18  Start time: 10:56AM End time: 10:58AM Total time: 2 mintues  Type of Contact: Follow up Call  Patient is currently on vacation and will be available to talk after next Monday (03/17/18).  Ritta Slot

## 2018-04-01 ENCOUNTER — Telehealth: Payer: Self-pay

## 2018-04-01 NOTE — Telephone Encounter (Signed)
[facility] - Left  message non 03-31-2018

## 2018-04-03 ENCOUNTER — Encounter: Payer: Self-pay | Admitting: Family Medicine

## 2018-05-09 ENCOUNTER — Telehealth: Payer: Self-pay

## 2018-05-09 NOTE — Telephone Encounter (Signed)
[facility] - Patient was at work and was not able to speak to me.  Writer provided patient with the [facility] phone number [phone]) in order for her to call me back to compete a follow up session.   Patient denies SI/HI/Psychosis/Substance Abuse. If your symptoms worsen or you have thoughts of suicide/homicide, PLEASE SEEK IMMEDIATE MEDICAL ATTENTION.  You may always call:  National Suicide Hotline: (831)492-5040;  [place] Crisis Line: 857-079-6554;  Crisis Recovery in [place]: (520) 810-7817.  These are available 24 hours a day, 7 days a week.

## 2018-05-12 ENCOUNTER — Telehealth: Payer: Self-pay

## 2018-05-12 NOTE — Telephone Encounter (Signed)
[facility] - 2nd attempt  

## 2018-05-14 ENCOUNTER — Telehealth: Payer: Self-pay

## 2018-05-14 NOTE — Telephone Encounter (Signed)
Several attempts have been made to contact patient without success. Patient will be placed on the inactive list.  If services are needed again.  Please contact [facility] at [phone].    Information will be routed to the PCP and Dr. Hisada  

## 2018-06-03 ENCOUNTER — Other Ambulatory Visit: Payer: Self-pay | Admitting: Family Medicine

## 2018-06-03 ENCOUNTER — Telehealth: Payer: Self-pay | Admitting: Family Medicine

## 2018-06-03 NOTE — Telephone Encounter (Signed)
PT LVM that she needs a refill on Adderall

## 2018-06-04 ENCOUNTER — Other Ambulatory Visit: Payer: Self-pay | Admitting: Family Medicine

## 2018-06-04 MED ORDER — AMPHETAMINE-DEXTROAMPHET ER 20 MG PO CP24: 20.0000 mg | ORAL_CAPSULE | ORAL | 0 refills | Status: DC

## 2018-06-04 MED ORDER — AMPHETAMINE-DEXTROAMPHET ER 20 MG PO CP24: 20.0000 mg | ORAL_CAPSULE | Freq: Every day | ORAL | 0 refills | Status: DC

## 2018-06-04 NOTE — Telephone Encounter (Signed)
Medication sent to her pharmacy on file

## 2018-06-04 NOTE — Telephone Encounter (Signed)
Was prescribed adderall in April and was told to follow up in 6 months. Requesting refill since not due to come back until Oct

## 2018-06-16 ENCOUNTER — Telehealth: Payer: Self-pay | Admitting: Family Medicine

## 2018-06-16 NOTE — Telephone Encounter (Signed)
Needs to talk with you about rx

## 2018-06-16 NOTE — Telephone Encounter (Signed)
adderall is not covered by her insurance since she is over 32 years of age. Will require PA

## 2018-06-17 NOTE — Telephone Encounter (Signed)
I advise the pt of the below, and advise she may want to reach out to her insurance.

## 2018-07-31 ENCOUNTER — Encounter: Payer: Self-pay | Admitting: Family Medicine

## 2018-07-31 ENCOUNTER — Ambulatory Visit: Payer: No Typology Code available for payment source | Admitting: Family Medicine

## 2018-07-31 VITALS — BP 140/90 | HR 95 | Resp 12 | Ht 63.0 in | Wt 176.1 lb

## 2018-07-31 DIAGNOSIS — F322 Major depressive disorder, single episode, severe without psychotic features: Secondary | ICD-10-CM

## 2018-07-31 DIAGNOSIS — E785 Hyperlipidemia, unspecified: Secondary | ICD-10-CM

## 2018-07-31 DIAGNOSIS — F32A Depression, unspecified: Secondary | ICD-10-CM

## 2018-07-31 DIAGNOSIS — F909 Attention-deficit hyperactivity disorder, unspecified type: Secondary | ICD-10-CM

## 2018-07-31 DIAGNOSIS — F419 Anxiety disorder, unspecified: Secondary | ICD-10-CM

## 2018-07-31 DIAGNOSIS — N926 Irregular menstruation, unspecified: Secondary | ICD-10-CM

## 2018-07-31 DIAGNOSIS — F329 Major depressive disorder, single episode, unspecified: Secondary | ICD-10-CM

## 2018-07-31 DIAGNOSIS — E669 Obesity, unspecified: Secondary | ICD-10-CM | POA: Diagnosis not present

## 2018-07-31 DIAGNOSIS — E66811 Obesity, class 1: Secondary | ICD-10-CM

## 2018-07-31 LAB — POCT URINE PREGNANCY: PREG TEST UR: NEGATIVE

## 2018-07-31 MED ORDER — CITALOPRAM HYDROBROMIDE 20 MG PO TABS: 20.0000 mg | ORAL_TABLET | Freq: Every day | ORAL | 4 refills | Status: DC

## 2018-07-31 MED ORDER — AMPHETAMINE-DEXTROAMPHET ER 20 MG PO CP24: 20.0000 mg | ORAL_CAPSULE | ORAL | 0 refills | Status: DC

## 2018-07-31 NOTE — Patient Instructions (Signed)
F/u in 11 weeks, call if you need me sooner  Increase dose of citalopram to 20 mg once daily  You are referred for therapy in [place]  Urine test is negative for pregnancy  Fasting labs asap, cBC, lipid, cmp and eGFR , tSH   It is important that you exercise regularly at least 30 minutes 5 times a week. If you develop chest pain, have severe difficulty breathing, or feel very tired, stop exercising immediately and seek medical attention  Thank you  for choosing [facility]. We consider it a privelige to serve you.  Delivering excellent health care in a caring and  compassionate way is our goal.  Partnering with you,  so that together we can achieve this goal is our strategy.    

## 2018-07-31 NOTE — Assessment & Plan Note (Signed)
Increase dose of citalopram and refer to therapy in [place]

## 2018-07-31 NOTE — Progress Notes (Signed)
Fill Date ID Written Drug Qty Days Prescriber Rx # Pharmacy Refill Daily Dose * Pymt Type PMP  06/18/2018  1   06/04/2018  Dextroamp-Amphet Er 20 Mg Cap  30.00 30 Ma Sim  [id number]  [id number]  0/0  Comm Ins  [place]  01/31/2018  1   01/31/2018  Dextroamp-Amphet Er 20 Mg Cap  30.00 30 Ma Sim  [id number]  [facility] ([id number])  0/0

## 2018-08-03 NOTE — Progress Notes (Signed)
   Danielle Shah     MRN: [id number]      DOB: [birth year]   HPI Danielle Shah is here for follow up and re-evaluation of chronic medical conditions, medication management and review of any available recent lab and radiology data.  . The PT denies any adverse reactions to current medications since the last visit.  Requests referral to therapist and reports uncontrolled depression, not suicidal or homicidal, states however that she is aware that her emotions are uncontrolled , as also observed by her  Partner Working on weight loss and is frustrated by this to some extent C/o irregulasr bleeding , delayed and lighter than usual, had negative pregnancy test but needs to check on this  ROS Denies recent fever or chills. Denies sinus pressure, nasal congestion, ear pain or sore throat. Denies chest congestion, productive cough or wheezing. Denies chest pains, palpitations and leg swelling Denies abdominal pain, nausea, vomiting,diarrhea or constipation.   Denies dysuria, frequency, hesitancy or incontinence. Denies joint pain, swelling and limitation in mobility. Denies headaches, seizures, numbness, or tingling. Denies skin break down or rash.   PE  BP 140/90 (BP Location: Right Arm, Patient Position: Sitting, Cuff Size: Normal)   Pulse 95   Resp 12   Ht 5\' 3"  (1.6 m)   Wt 176 lb 1.9 oz (79.9 kg)   LMP 07/29/2018 (Exact Date)   SpO2 99% Comment: room air  BMI 31.20 kg/m   Patient alert and oriented and in no cardiopulmonary distress.  HEENT: No facial asymmetry, EOMI,   oropharynx pink and moist.  Neck supple no JVD, no mass.  Chest: Clear to auscultation bilaterally.  CVS: S1, S2 no murmurs, no S3.Regular rate.  ABD: Soft non tender.   Ext: No edema  MS: Adequate ROM spine, shoulders, hips and knees.  Skin: Intact, no ulcerations or rash noted.  Psych: Good eye contact, normal affect. Memory intact mildly  anxious , becomes tearful when discussing her mental health needs,  but is not  depressed appearing.  CNS: CN 2-12 intact, power,  normal throughout.no focal deficits noted.   Assessment & Plan  Depression, major, single episode, severe (HCC) Increase dose of citalopram and refer to therapy in [place]  ADHD (attention deficit hyperactivity disorder) Controlled, no change in medication   Anxiety and depression Increase medication dose and refer to therapy F/u in 12 weeks  Obesity (BMI 30.0-34.9) Deteriorated. Patient re-educated about  the importance of commitment to a  minimum of 150 minutes of exercise per week.  The importance of healthy food choices with portion control discussed. Encouraged to start a food diary, count calories and to consider  joining a support group. Sample diet sheets offered. Goals set by the patient for the next several months.   Weight /BMI 07/31/2018 01/29/2018 12/10/2017  WEIGHT 176 lb 1.9 oz 175 lb 172 lb  HEIGHT 5\' 3"  5\' 3"  5\' 3"   BMI 31.2 kg/m2 31 kg/m2 30.47 kg/m2      Irregular menstrual bleeding Need pregnancy test to establish efficacy of her contraception, no desire to conceive at this time

## 2018-08-04 DIAGNOSIS — N926 Irregular menstruation, unspecified: Secondary | ICD-10-CM | POA: Insufficient documentation

## 2018-08-04 MED ORDER — AMPHETAMINE-DEXTROAMPHET ER 20 MG PO CP24: 20.0000 mg | ORAL_CAPSULE | ORAL | 0 refills | Status: DC

## 2018-08-04 NOTE — Assessment & Plan Note (Signed)
Controlled, no change in medication  

## 2018-08-04 NOTE — Assessment & Plan Note (Signed)
Deteriorated. Patient re-educated about  the importance of commitment to a  minimum of 150 minutes of exercise per week.  The importance of healthy food choices with portion control discussed. Encouraged to start a food diary, count calories and to consider  joining a support group. Sample diet sheets offered. Goals set by the patient for the next several months.   Weight /BMI 07/31/2018 01/29/2018 12/10/2017  WEIGHT 176 lb 1.9 oz 175 lb 172 lb  HEIGHT 5\' 3"  5\' 3"  5\' 3"   BMI 31.2 kg/m2 31 kg/m2 30.47 kg/m2

## 2018-08-04 NOTE — Assessment & Plan Note (Signed)
Need pregnancy test to establish efficacy of her contraception, no desire to conceive at this time

## 2018-08-04 NOTE — Assessment & Plan Note (Signed)
Increase medication dose and refer to therapy F/u in 12 weeks

## 2018-10-13 ENCOUNTER — Ambulatory Visit: Payer: No Typology Code available for payment source | Admitting: Family Medicine

## 2018-10-29 ENCOUNTER — Ambulatory Visit (INDEPENDENT_AMBULATORY_CARE_PROVIDER_SITE_OTHER): Payer: No Typology Code available for payment source | Admitting: Family Medicine

## 2018-10-29 ENCOUNTER — Telehealth: Payer: Self-pay | Admitting: *Deleted

## 2018-10-29 ENCOUNTER — Encounter: Payer: Self-pay | Admitting: Family Medicine

## 2018-10-29 VITALS — BP 118/86 | HR 98 | Temp 99.2°F | Resp 16 | Ht 63.0 in | Wt 177.0 lb

## 2018-10-29 DIAGNOSIS — F900 Attention-deficit hyperactivity disorder, predominantly inattentive type: Secondary | ICD-10-CM

## 2018-10-29 DIAGNOSIS — E66811 Obesity, class 1: Secondary | ICD-10-CM

## 2018-10-29 DIAGNOSIS — F419 Anxiety disorder, unspecified: Secondary | ICD-10-CM

## 2018-10-29 DIAGNOSIS — F322 Major depressive disorder, single episode, severe without psychotic features: Secondary | ICD-10-CM | POA: Diagnosis not present

## 2018-10-29 DIAGNOSIS — R6889 Other general symptoms and signs: Secondary | ICD-10-CM

## 2018-10-29 DIAGNOSIS — E669 Obesity, unspecified: Secondary | ICD-10-CM | POA: Diagnosis not present

## 2018-10-29 LAB — POCT INFLUENZA A/B
Influenza A, POC: NEGATIVE
Influenza B, POC: NEGATIVE

## 2018-10-29 MED ORDER — OSELTAMIVIR PHOSPHATE 75 MG PO CAPS: 75.0000 mg | ORAL_CAPSULE | Freq: Two times a day (BID) | ORAL | 0 refills | Status: DC

## 2018-10-29 MED ORDER — BENZONATATE 100 MG PO CAPS: 100.0000 mg | ORAL_CAPSULE | Freq: Two times a day (BID) | ORAL | 0 refills | Status: DC | PRN

## 2018-10-29 MED ORDER — CHLORPHENIRAMINE MALEATE 4 MG PO TABS: 4.0000 mg | ORAL_TABLET | Freq: Two times a day (BID) | ORAL | 0 refills | Status: DC | PRN

## 2018-10-29 MED ORDER — HYDROXYZINE HCL 25 MG PO TABS: ORAL_TABLET | ORAL | 3 refills | Status: DC

## 2018-10-29 MED ORDER — ALBUTEROL SULFATE HFA 108 (90 BASE) MCG/ACT IN AERS: 2.0000 | INHALATION_SPRAY | Freq: Four times a day (QID) | RESPIRATORY_TRACT | 0 refills | Status: DC | PRN

## 2018-10-29 MED ORDER — HYDROXYZINE PAMOATE 25 MG PO CAPS: ORAL_CAPSULE | ORAL | 5 refills | Status: DC

## 2018-10-29 NOTE — Patient Instructions (Addendum)
F/U in 13 weeks, call if you need me sooner  Tylenol 2 tabs will be given to you before you leave  You are being treated for influenza , tamiflu, tessalon perles  and proventil are prescribed  Work excuse from today to return next week Monday  Fluids , rest , cover the cough and hand washing, ensure good fluid intake   New for sleep is hydroxyzine at bedtime

## 2018-10-29 NOTE — Telephone Encounter (Signed)
error 

## 2018-10-31 ENCOUNTER — Encounter: Payer: Self-pay | Admitting: Family Medicine

## 2018-10-31 DIAGNOSIS — R6889 Other general symptoms and signs: Secondary | ICD-10-CM | POA: Insufficient documentation

## 2018-10-31 MED ORDER — AMPHETAMINE-DEXTROAMPHET ER 20 MG PO CP24: 20.0000 mg | ORAL_CAPSULE | ORAL | 0 refills | Status: DC

## 2018-10-31 NOTE — Assessment & Plan Note (Signed)
Symptoms controlled on medication with improved function, medication prescribed

## 2018-10-31 NOTE — Assessment & Plan Note (Signed)
unchnaged Patient re-educated about  the importance of commitment to a  minimum of 150 minutes of exercise per week.  The importance of healthy food choices with portion control discussed. Encouraged to start a food diary, count calories and to consider  joining a support group. Sample diet sheets offered. Goals set by the patient for the next several months.   Weight /BMI 10/29/2018 07/31/2018 01/29/2018  WEIGHT 177 lb 176 lb 1.9 oz 175 lb  HEIGHT - 5\' 3"  5\' 3"   BMI 31.35 kg/m2 31.2 kg/m2 31 kg/m2

## 2018-10-31 NOTE — Progress Notes (Signed)
   MARGARETMARY PRISK     MRN: [id number]      DOB: 28-Aug-1986   HPI Ms. Quigley is here for follow up and re-evaluation of chronic medical conditions, medication management and review of any available recent lab and radiology data.  2 day h/o acute onset  chills and body aches cough and sore throat with positive flu exposure.  Improved focus and concentration with adderall Improved depression and has established with a therapis  ROS Denies chest pains, palpitations and leg swelling Denies abdominal pain, nausea, vomiting,diarrhea or constipation.   Denies dysuria, frequency, hesitancy or incontinence. Denies joint pain, swelling and limitation in mobility. Denies headaches, seizures, numbness, or tingling. Denies uncontrolled  depression, anxiety or insomnia. Denies skin break down or rash.   PE  BP 118/86   Pulse 98   Temp 99.2 F (37.3 C) (Oral)   Resp 16   Wt 177 lb (80.3 kg)   SpO2 97%   BMI 31.35 kg/m   Patient alert and oriented and in no cardiopulmonary distress.Ill appearing HEENT: No facial asymmetry, EOMI,   oropharynx pink and moist.  Neck supple no JVD, no mass.  Chest: decreased though adequate air entry, no crackles or wheezes  CVS: S1, S2 no murmurs, no S3.Regular rate.  ABD: Soft non tender.   Ext: No edema  MS: Adequate ROM spine, shoulders, hips and knees.  Skin: Intact, no ulcerations or rash noted.  Psych: Good eye contact, normal affect. Memory intact not anxious or depressed appearing.  CNS: CN 2-12 intact, power,  normal throughout.no focal deficits noted.   Assessment & Plan  Flu-like symptoms Negative POC testing, however clinical presentation is c/w influenza, tamiflu, tessalon perle and albuterol prescribed Work excuse to return on 11/03/2018  Obesity (BMI 30.0-34.9) unchnaged Patient re-educated about  the importance of commitment to a  minimum of 150 minutes of exercise per week.  The importance of healthy food choices with portion  control discussed. Encouraged to start a food diary, count calories and to consider  joining a support group. Sample diet sheets offered. Goals set by the patient for the next several months.   Weight /BMI 10/29/2018 07/31/2018 01/29/2018  WEIGHT 177 lb 176 lb 1.9 oz 175 lb  HEIGHT - 5\' 3"  5\' 3"   BMI 31.35 kg/m2 31.2 kg/m2 31 kg/m2      Depression, major, single episode, severe (HCC) Improved continue medication and therapy  ADHD (attention deficit hyperactivity disorder) Symptoms controlled on medication with improved function, medication prescribed  Anxiety Poor sleep associated with anxiety, start hydroxyzine at bedtime

## 2018-10-31 NOTE — Assessment & Plan Note (Signed)
Negative POC testing, however clinical presentation is c/w influenza, tamiflu, tessalon perle and albuterol prescribed Work excuse to return on 11/03/2018

## 2018-10-31 NOTE — Assessment & Plan Note (Signed)
Poor sleep associated with anxiety, start hydroxyzine at bedtime

## 2018-10-31 NOTE — Assessment & Plan Note (Signed)
Improved continue medication and therapy

## 2018-11-05 ENCOUNTER — Ambulatory Visit: Payer: No Typology Code available for payment source | Admitting: Family Medicine

## 2018-11-16 ENCOUNTER — Other Ambulatory Visit: Payer: Self-pay | Admitting: Family Medicine

## 2018-11-24 ENCOUNTER — Other Ambulatory Visit: Payer: Self-pay

## 2018-11-24 MED ORDER — ALBUTEROL SULFATE HFA 108 (90 BASE) MCG/ACT IN AERS: 2.0000 | INHALATION_SPRAY | Freq: Four times a day (QID) | RESPIRATORY_TRACT | 1 refills | Status: DC | PRN

## 2019-01-28 ENCOUNTER — Ambulatory Visit: Payer: No Typology Code available for payment source | Admitting: Family Medicine

## 2019-01-29 ENCOUNTER — Ambulatory Visit (INDEPENDENT_AMBULATORY_CARE_PROVIDER_SITE_OTHER): Payer: No Typology Code available for payment source | Admitting: Family Medicine

## 2019-01-29 ENCOUNTER — Other Ambulatory Visit: Payer: Self-pay

## 2019-01-29 ENCOUNTER — Encounter: Payer: Self-pay | Admitting: Family Medicine

## 2019-01-29 VITALS — BP 128/88 | Ht 63.0 in | Wt 165.0 lb

## 2019-01-29 DIAGNOSIS — N926 Irregular menstruation, unspecified: Secondary | ICD-10-CM

## 2019-01-29 DIAGNOSIS — E663 Overweight: Secondary | ICD-10-CM

## 2019-01-29 DIAGNOSIS — J302 Other seasonal allergic rhinitis: Secondary | ICD-10-CM

## 2019-01-29 DIAGNOSIS — F322 Major depressive disorder, single episode, severe without psychotic features: Secondary | ICD-10-CM | POA: Diagnosis not present

## 2019-01-29 DIAGNOSIS — E785 Hyperlipidemia, unspecified: Secondary | ICD-10-CM

## 2019-01-29 DIAGNOSIS — E669 Obesity, unspecified: Secondary | ICD-10-CM | POA: Diagnosis not present

## 2019-01-29 DIAGNOSIS — F900 Attention-deficit hyperactivity disorder, predominantly inattentive type: Secondary | ICD-10-CM | POA: Diagnosis not present

## 2019-01-29 MED ORDER — AMPHETAMINE-DEXTROAMPHET ER 20 MG PO CP24: 20.0000 mg | ORAL_CAPSULE | ORAL | 0 refills | Status: DC

## 2019-01-29 MED ORDER — LORATADINE 10 MG PO TABS: 10.0000 mg | ORAL_TABLET | Freq: Every day | ORAL | 1 refills | Status: DC

## 2019-01-29 MED ORDER — CITALOPRAM HYDROBROMIDE 20 MG PO TABS: 20.0000 mg | ORAL_TABLET | Freq: Every day | ORAL | 1 refills | Status: DC

## 2019-01-29 NOTE — Progress Notes (Signed)
Virtual Visit via Telephone Note  I connected with Danielle Shah on 01/29/19 at  9:00 AM EDT by telephone and verified that I am speaking with the correct person using two identifiers.   I discussed the limitations, risks, security and privacy concerns of performing an evaluation and management service by telephone and the availability of in person appointments. I also discussed with the patient that there may be a patient responsible charge related to this service. The patient expressed understanding and agreed to proceed. I am in my home and the patient is also in her home, unable to make virtual connection   History of Present Illness: F/U chronic problems and review  C/o excess menstrual bleeding , bleeding every 2 weeks, for approx 2 weeks, and she is also experiencing  Excessive menstrual pain with bleeding C/o increased and uncontrolled allergy symptoms , has not been taking medication regularly   Observations/Objective: BP 128/88   Ht 5\' 3"  (1.6 m)   Wt 165 lb (74.8 kg)   BMI 29.23 kg/m  Denies recent fever or chills. Denies ear pain or sore throat. Denies chest congestion, productive cough or wheezing. Denies chest pains, palpitations and leg swelling Denies abdominal pain, nausea, vomiting,diarrhea or constipation.   Denies dysuria, frequency, hesitancy or incontinence. Denies joint pain, swelling and limitation in mobility. Denies headaches, seizures, numbness, or tingling. Denies uncontrolled depression, anxiety or insomnia. Denies skin break down or rash.     Assessment and Plan: Depression, major, single episode, severe ([place]) Controlled, no change in medication DASH diet and commitment to daily physical activity for a minimum of 30 minutes discussed and encouraged, as a part of hypertension management. The importance of attaining a healthy weight is also discussed.  BP/Weight 01/29/2019 10/29/2018 07/31/2018 01/29/2018 12/10/2017 02/11/3148 7/0/2637  Systolic BP 858  850 277 412 878 676 720  Diastolic BP 88 86 90 80 88 76 84  Wt. (Lbs) 165 177 176.12 175 172 161 162  BMI 29.23 31.35 31.2 31 30.47 28.53 28.7       Overweight (BMI 25.0-29.9) Improved  Patient re-educated about  the importance of commitment to a  minimum of 150 minutes of exercise per week as able.  The importance of healthy food choices with portion control discussed, as well as eating regularly and within a 12 hour window most days. The need to choose "clean , green" food 50 to 75% of the time is discussed, as well as to make water the primary drink and set a goal of 64 ounces water daily.  Encouraged to start a food diary,  and to consider  joining a support group. Sample diet sheets offered. Goals set by the patient for the next several months.   Weight /BMI 01/29/2019 10/29/2018 07/31/2018  WEIGHT 165 lb 177 lb 176 lb 1.9 oz  HEIGHT 5\' 3"  5\' 3"  5\' 3"   BMI 29.23 kg/m2 31.35 kg/m2 31.2 kg/m2      ADHD (attention deficit hyperactivity disorder) Controlled, no change in medication   Irregular menstrual bleeding Irregular menses with heavy bleeding, pt will follow up with planned gyne, she is to schedule an appt, discontinued OCP as trying to get pregnant later this year  Allergic rhinitis Increased and uncontrolled symptoms, pt to start daily loratidine     Follow Up Instructions:    I discussed the assessment and treatment plan with the patient. The patient was provided an opportunity to ask questions and all were answered. The patient agreed with the plan and demonstrated an  understanding of the instructions.   The patient was advised to call back or seek an in-person evaluation if the symptoms worsen or if the condition fails to improve as anticipated.  I provided 14 minutes of non-face-to-face time during this encounter.   Tula Nakayama, MD

## 2019-01-29 NOTE — Assessment & Plan Note (Signed)
Increased and uncontrolled symptoms, pt to start daily loratidine

## 2019-01-29 NOTE — Assessment & Plan Note (Signed)
Controlled, no change in medication DASH diet and commitment to daily physical activity for a minimum of 30 minutes discussed and encouraged, as a part of hypertension management. The importance of attaining a healthy weight is also discussed.  BP/Weight 01/29/2019 10/29/2018 07/31/2018 01/29/2018 12/10/2017 05/10/4620 06/11/7124  Systolic BP 271 292 909 030 149 969 249  Diastolic BP 88 86 90 80 88 76 84  Wt. (Lbs) 165 177 176.12 175 172 161 162  BMI 29.23 31.35 31.2 31 30.47 28.53 28.7

## 2019-01-29 NOTE — Assessment & Plan Note (Signed)
Improved  Patient re-educated about  the importance of commitment to a  minimum of 150 minutes of exercise per week as able.  The importance of healthy food choices with portion control discussed, as well as eating regularly and within a 12 hour window most days. The need to choose "clean , green" food 50 to 75% of the time is discussed, as well as to make water the primary drink and set a goal of 64 ounces water daily.  Encouraged to start a food diary,  and to consider  joining a support group. Sample diet sheets offered. Goals set by the patient for the next several months.   Weight /BMI 01/29/2019 10/29/2018 07/31/2018  WEIGHT 165 lb 177 lb 176 lb 1.9 oz  HEIGHT 5\' 3"  5\' 3"  5\' 3"   BMI 29.23 kg/m2 31.35 kg/m2 31.2 kg/m2

## 2019-01-29 NOTE — Assessment & Plan Note (Signed)
Controlled, no change in medication  

## 2019-01-29 NOTE — Patient Instructions (Signed)
F/u in 15 weeks, call if you need me sooner  CBC, fasting lipid, chem 7 and EGFr at [facility] lab in [place], in May please  Congrats on weight loss , keep it up!   NO new medication for menstrual cramp, due to drug interaction, I recommend Gyne consult   Generic claritin, loratadine is prescribed for allergies  Please take 1 multivitamin daily  Social distancing. Frequent hand washing with soap and water Keeping your hands off of your face. These 3 practices will help to keep both you and your community healthy during this time. Please practice them faithfully!  Thanks for choosing [facility], we consider it a privelige to serve you.

## 2019-01-29 NOTE — Assessment & Plan Note (Signed)
Irregular menses with heavy bleeding, pt will follow up with planned gyne, she is to schedule an appt, discontinued OCP as trying to get pregnant later this year

## 2019-02-04 ENCOUNTER — Other Ambulatory Visit: Payer: Self-pay | Admitting: Advanced Practice Midwife

## 2019-02-04 DIAGNOSIS — N921 Excessive and frequent menstruation with irregular cycle: Secondary | ICD-10-CM

## 2019-05-14 ENCOUNTER — Ambulatory Visit: Payer: No Typology Code available for payment source | Admitting: Family Medicine

## 2019-06-12 ENCOUNTER — Telehealth: Payer: Self-pay | Admitting: *Deleted

## 2019-06-12 NOTE — Telephone Encounter (Signed)
Pt called needing adderall sent to [facility] let her know it would be Tuesday as everyone is out of the office

## 2019-06-16 NOTE — Telephone Encounter (Signed)
Called pt to set up an appt let her know that she would need an appt before prescription could be sent in. She stated she was just seen. I looked back and informed her her last appt here was in April and she had one in August that was cancelled via mychart. Let her know that we could see her this week to get this filled for her. Gave her the message that [place] sent to me and she said she will not make the appt until the nurse called her back because she doesn't understand why she needs to make an appt. Would like a call back from the nurse. Would not schedule with me.

## 2019-06-16 NOTE — Telephone Encounter (Signed)
pls let pt know this is a scheduled medication which requires evaluation in person on a regular schedule every 12 to at most 16  weeks

## 2019-06-16 NOTE — Telephone Encounter (Signed)
She should have scheduled a 3 month follow up at her last visit in April.only 3 rxs are sent at a time and a visit is needed before the 3 more can be sent

## 2019-06-17 NOTE — Telephone Encounter (Signed)
I made patient aware of this and she was irritated and said today was her last day of vacation. Transferred her to [place] to work out an appt

## 2019-06-18 ENCOUNTER — Ambulatory Visit (INDEPENDENT_AMBULATORY_CARE_PROVIDER_SITE_OTHER): Payer: No Typology Code available for payment source | Admitting: Family Medicine

## 2019-06-18 ENCOUNTER — Other Ambulatory Visit: Payer: Self-pay

## 2019-06-18 ENCOUNTER — Encounter: Payer: Self-pay | Admitting: Family Medicine

## 2019-06-18 VITALS — BP 128/82 | Ht 63.0 in | Wt 158.0 lb

## 2019-06-18 DIAGNOSIS — F900 Attention-deficit hyperactivity disorder, predominantly inattentive type: Secondary | ICD-10-CM | POA: Diagnosis not present

## 2019-06-18 DIAGNOSIS — E663 Overweight: Secondary | ICD-10-CM

## 2019-06-18 DIAGNOSIS — F322 Major depressive disorder, single episode, severe without psychotic features: Secondary | ICD-10-CM

## 2019-06-18 MED ORDER — CITALOPRAM HYDROBROMIDE 20 MG PO TABS: 20.0000 mg | ORAL_TABLET | Freq: Every day | ORAL | 1 refills | Status: DC

## 2019-06-18 MED ORDER — AMPHETAMINE-DEXTROAMPHET ER 20 MG PO CP24: 20.0000 mg | ORAL_CAPSULE | ORAL | 0 refills | Status: DC

## 2019-06-18 NOTE — Patient Instructions (Signed)
F/u in 4 months, call if you need me sooner  Medication will continue as before  Please commit to regular exercise , aim for at least 4 days per week  Congrats on weight loss, still need to shed a few more pounds!  Please call gyne re bleeding, and also ask that they send your most recent labs  Thanks for choosing [facility], we consider it a privelige to serve you.

## 2019-06-18 NOTE — Progress Notes (Signed)
Virtual Visit via Telephone Note  I connected with Vernie Murders on 06/18/19 at  1:00 PM EDT by telephone and verified that I am speaking with the correct person using two identifiers.  Location: Patient:work  Provider: office  I discussed the limitations, risks, security and privacy concerns of performing an evaluation and management service by telephone and the availability of in person appointments. I also discussed with the patient that there may be a patient responsible charge related to this service. The patient expressed understanding and agreed to proceed.   History of Present Illness: F/u chronic problems and medication ,management States doing well on current dose of medications for treating ADHD and depression. Denies symptoms of depression, recently closing on a house with her fiance and adjusting to her job which she states is stressful. C/o heavy menses, recently saw Gyne and will f/u with them Denies recent fever or chills. Denies sinus pressure, nasal congestion, ear pain or sore throat. Denies chest congestion, productive cough or wheezing. Denies chest pains, palpitations and leg swelling Denies abdominal pain, nausea, vomiting,diarrhea or constipation.   Denies dysuria, frequency, hesitancy or incontinence. Denies joint pain, swelling and limitation in mobility. Denies headaches, seizures, numbness, or tingling. Denies depression, anxiety or insomnia. Denies skin break down or rash. Will get her flu vaccine at work Working on weight loss with success      Observations/Objective: Ht 5\' 3"  (1.6 m)   Wt 158 lb (71.7 kg)   BMI 27.99 kg/m   Good communication with no confusion and intact memory. Alert and oriented x 3 No signs of respiratory distress during speech     Assessment and Plan: ADHD (attention deficit hyperactivity disorder) Controlled on current medication, 4 month supply prescribed  Depression, major, single episode, severe  (HCC) Controlled, no change in medication   Overweight (BMI 25.0-29.9)  Patient re-educated about  the importance of commitment to a  minimum of 150 minutes of exercise per week as able.  The importance of healthy food choices with portion control discussed, as well as eating regularly and within a 12 hour window most days. The need to choose "clean , green" food 50 to 75% of the time is discussed, as well as to make water the primary drink and set a goal of 64 ounces water daily.    Weight /BMI 06/18/2019 01/29/2019 10/29/2018  WEIGHT 158 lb 165 lb 177 lb  HEIGHT 5\' 3"  5\' 3"  5\' 3"   BMI 27.99 kg/m2 29.23 kg/m2 31.35 kg/m2        Follow Up Instructions:    I discussed the assessment and treatment plan with the patient. The patient was provided an opportunity to ask questions and all were answered. The patient agreed with the plan and demonstrated an understanding of the instructions.   The patient was advised to call back or seek an in-person evaluation if the symptoms worsen or if the condition fails to improve as anticipated.  I provided 22 minutes of non-face-to-face time during this encounter.   Tula Nakayama, MD

## 2019-06-20 ENCOUNTER — Encounter: Payer: Self-pay | Admitting: Family Medicine

## 2019-06-20 NOTE — Assessment & Plan Note (Signed)
  Patient re-educated about  the importance of commitment to a  minimum of 150 minutes of exercise per week as able.  The importance of healthy food choices with portion control discussed, as well as eating regularly and within a 12 hour window most days. The need to choose "clean , green" food 50 to 75% of the time is discussed, as well as to make water the primary drink and set a goal of 64 ounces water daily.    Weight /BMI 06/18/2019 01/29/2019 10/29/2018  WEIGHT 158 lb 165 lb 177 lb  HEIGHT 5\' 3"  5\' 3"  5\' 3"   BMI 27.99 kg/m2 29.23 kg/m2 31.35 kg/m2

## 2019-06-20 NOTE — Assessment & Plan Note (Signed)
Controlled on current medication, 4 month supply prescribed

## 2019-06-20 NOTE — Assessment & Plan Note (Signed)
Controlled, no change in medication  

## 2019-08-19 ENCOUNTER — Telehealth: Payer: Self-pay

## 2019-08-19 NOTE — Telephone Encounter (Signed)
Ob to determine celexa I agree with your recommendations

## 2019-08-19 NOTE — Telephone Encounter (Signed)
Patient called to say she just found out she was pregnant and wanted to know if she should continue her meds. I advised her to call and make appt with obgyn and let them know about the celexa and if it was ok to continue until her visit with them. (I did advise her to stop the adderall as well and she already had)

## 2019-09-09 ENCOUNTER — Other Ambulatory Visit: Payer: Self-pay

## 2019-09-09 ENCOUNTER — Inpatient Hospital Stay (HOSPITAL_COMMUNITY)
Admission: AD | Admit: 2019-09-09 | Discharge: 2019-09-09 | Disposition: A | Discharge status: Home / Self Care | Payer: BC Managed Care – PPO | Attending: Obstetrics and Gynecology | Admitting: Obstetrics and Gynecology

## 2019-09-09 ENCOUNTER — Encounter (HOSPITAL_COMMUNITY): Payer: Self-pay

## 2019-09-09 DIAGNOSIS — Z87891 Personal history of nicotine dependence: Secondary | ICD-10-CM | POA: Diagnosis not present

## 2019-09-09 DIAGNOSIS — O99341 Other mental disorders complicating pregnancy, first trimester: Secondary | ICD-10-CM | POA: Diagnosis not present

## 2019-09-09 DIAGNOSIS — Z880 Allergy status to penicillin: Secondary | ICD-10-CM | POA: Insufficient documentation

## 2019-09-09 DIAGNOSIS — O99891 Other specified diseases and conditions complicating pregnancy: Secondary | ICD-10-CM | POA: Insufficient documentation

## 2019-09-09 DIAGNOSIS — R1030 Lower abdominal pain, unspecified: Secondary | ICD-10-CM | POA: Diagnosis present

## 2019-09-09 DIAGNOSIS — M549 Dorsalgia, unspecified: Secondary | ICD-10-CM | POA: Diagnosis not present

## 2019-09-09 DIAGNOSIS — F419 Anxiety disorder, unspecified: Secondary | ICD-10-CM | POA: Insufficient documentation

## 2019-09-09 DIAGNOSIS — Z79899 Other long term (current) drug therapy: Secondary | ICD-10-CM | POA: Insufficient documentation

## 2019-09-09 DIAGNOSIS — F329 Major depressive disorder, single episode, unspecified: Secondary | ICD-10-CM | POA: Insufficient documentation

## 2019-09-09 DIAGNOSIS — Z8249 Family history of ischemic heart disease and other diseases of the circulatory system: Secondary | ICD-10-CM | POA: Insufficient documentation

## 2019-09-09 DIAGNOSIS — O26899 Other specified pregnancy related conditions, unspecified trimester: Secondary | ICD-10-CM

## 2019-09-09 DIAGNOSIS — O99211 Obesity complicating pregnancy, first trimester: Secondary | ICD-10-CM | POA: Diagnosis not present

## 2019-09-09 DIAGNOSIS — M791 Myalgia, unspecified site: Secondary | ICD-10-CM

## 2019-09-09 DIAGNOSIS — Z3A10 10 weeks gestation of pregnancy: Secondary | ICD-10-CM | POA: Insufficient documentation

## 2019-09-09 DIAGNOSIS — E669 Obesity, unspecified: Secondary | ICD-10-CM | POA: Diagnosis not present

## 2019-09-09 DIAGNOSIS — O26891 Other specified pregnancy related conditions, first trimester: Secondary | ICD-10-CM | POA: Diagnosis not present

## 2019-09-09 DIAGNOSIS — R519 Headache, unspecified: Secondary | ICD-10-CM | POA: Diagnosis not present

## 2019-09-09 HISTORY — DX: Depression, unspecified: F32.A

## 2019-09-09 HISTORY — DX: Benign neoplasm of connective and other soft tissue, unspecified: D21.9

## 2019-09-09 HISTORY — DX: Anxiety disorder, unspecified: F41.9

## 2019-09-09 LAB — POCT PREGNANCY, URINE: Preg Test, Ur: POSITIVE — AB

## 2019-09-09 LAB — URINALYSIS, ROUTINE W REFLEX MICROSCOPIC
Bilirubin Urine: NEGATIVE
Glucose, UA: NEGATIVE mg/dL
Hgb urine dipstick: NEGATIVE
Ketones, ur: 5 mg/dL — AB
Leukocytes,Ua: NEGATIVE
Nitrite: NEGATIVE
Protein, ur: NEGATIVE mg/dL
Specific Gravity, Urine: 1.017 (ref 1.005–1.030)
pH: 5 (ref 5.0–8.0)

## 2019-09-09 MED ORDER — CYCLOBENZAPRINE HCL 10 MG PO TABS: 10.0000 mg | ORAL_TABLET | Freq: Three times a day (TID) | ORAL | 0 refills | Status: DC | PRN

## 2019-09-09 MED ORDER — ACETAMINOPHEN 500 MG PO TABS
1000.0000 mg | ORAL_TABLET | Freq: Once | ORAL | Status: AC
  Administered 2019-09-09: 1000 mg via ORAL
  Filled 2019-09-09: qty 2

## 2019-09-09 MED ORDER — DIPHENHYDRAMINE HCL 50 MG/ML IJ SOLN
12.5000 mg | Freq: Once | INTRAMUSCULAR | Status: AC
  Administered 2019-09-09: 12.5 mg via INTRAVENOUS
  Filled 2019-09-09: qty 1

## 2019-09-09 MED ORDER — DEXAMETHASONE SODIUM PHOSPHATE 10 MG/ML IJ SOLN
10.0000 mg | Freq: Once | INTRAMUSCULAR | Status: AC
  Administered 2019-09-09: 10 mg via INTRAVENOUS
  Filled 2019-09-09: qty 1

## 2019-09-09 MED ORDER — LACTATED RINGERS IV BOLUS
1000.0000 mL | Freq: Once | INTRAVENOUS | Status: AC
  Administered 2019-09-09: 1000 mL via INTRAVENOUS

## 2019-09-09 MED ORDER — METOCLOPRAMIDE HCL 5 MG/ML IJ SOLN
10.0000 mg | Freq: Once | INTRAMUSCULAR | Status: AC
  Administered 2019-09-09: 10 mg via INTRAVENOUS
  Filled 2019-09-09: qty 2

## 2019-09-09 NOTE — MAU Provider Note (Signed)
History     CSN: [id number]  Arrival date and time: 09/09/19 1248   First Provider Initiated Contact with Patient 09/09/19 1426      Chief Complaint  Patient presents with  . Abdominal Pain   HPI   Ms.Brandilynn Chauncey Cruel Nagorski is a 33 y.o. female G3P0020 @ [redacted]w[redacted]d here in MAU with abdominal cramping and headache. The pain is all over her lower abdomen. She is concerned she may have a UTI. She also attests to left flank pain that started around the same time. 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 She has no fever or urinary complaints. Attests to HA's since finding out she is pregnant. Some photophobia, no N/v. No history of migraine HA. Tried tylenol this morning with minimal relief.   OB History    Gravida  3   Para  0   Term      Preterm      AB  2   Living  0     SAB  1   TAB  1   Ectopic      Multiple      Live Births              Past Medical History:  Diagnosis Date  . Anemia   . Anxiety   . Depression   . Fibroid   . Obesity (BMI 30.0-34.9)     Past Surgical History:  Procedure Laterality Date  . EYE SURGERY Right 2003  . WISDOM TOOTH EXTRACTION  2004    Family History  Problem Relation Age of Onset  . Heart disease Mother        CHF  . Arthritis Mother   . Hyperlipidemia Mother   . Hypertension Mother     Social History   Tobacco Use  . Smoking status: Former Smoker    Types: Cigarettes  . Smokeless tobacco: Never Used  Substance Use Topics  . Alcohol use: Not Currently    Comment: social drinking  . Drug use: Not Currently    Types: Marijuana    Comment: last use thanksgiving    Allergies:  Allergies  Allergen Reactions  . Penicillins     Medications Prior to Admission  Medication Sig Dispense Refill Last Dose  . amphetamine-dextroamphetamine (ADDERALL XR) 20 MG 24 hr capsule Take 1 capsule (20 mg total)  by mouth every morning for 29 days. 30 capsule 0   . amphetamine-dextroamphetamine (ADDERALL XR) 20 MG 24 hr capsule Take 1 capsule (20 mg total) by mouth every morning. 30 capsule 0   . amphetamine-dextroamphetamine (ADDERALL XR) 20 MG 24 hr capsule Take 1 capsule (20 mg total) by mouth every morning for 30 days. 30 capsule 0   . amphetamine-dextroamphetamine (ADDERALL XR) 20 MG 24 hr capsule Take 1 capsule (20 mg total) by mouth every morning. 30 capsule 0   . amphetamine-dextroamphetamine (ADDERALL XR) 20 MG 24 hr capsule Take 1 capsule (20 mg total) by mouth every morning. 30 capsule 0   . amphetamine-dextroamphetamine (ADDERALL XR) 20 MG 24 hr capsule Take 1 capsule (20 mg total) by mouth every morning. 30 capsule 0   . amphetamine-dextroamphetamine (ADDERALL XR) 20 MG 24 hr capsule Take 1 capsule (20 mg total) by mouth every morning. 30 capsule 0   . [START ON 09/17/2019] amphetamine-dextroamphetamine (ADDERALL XR) 20 MG 24 hr capsule Take 1  capsule (20 mg total) by mouth every morning. 30 capsule 0   . citalopram (CELEXA) 20 MG tablet Take 1 tablet (20 mg total) by mouth daily. 90 tablet 1    Results for orders placed or performed during the hospital encounter of 09/09/19 (from the past 48 hour(s))  Pregnancy, urine POC     Status: Abnormal   Collection Time: 09/09/19  1:05 PM  Result Value Ref Range   Preg Test, Ur POSITIVE (A) NEGATIVE    Comment:        THE SENSITIVITY OF THIS METHODOLOGY IS >24 mIU/mL   Urinalysis, Routine w reflex microscopic     Status: Abnormal   Collection Time: 09/09/19  1:06 PM  Result Value Ref Range   Color, Urine YELLOW YELLOW   APPearance HAZY (A) CLEAR   Specific Gravity, Urine 1.017 1.005 - 1.030   pH 5.0 5.0 - 8.0   Glucose, UA NEGATIVE NEGATIVE mg/dL   Hgb urine dipstick NEGATIVE NEGATIVE   Bilirubin Urine NEGATIVE NEGATIVE   Ketones, ur 5 (A) NEGATIVE mg/dL   Protein, ur NEGATIVE NEGATIVE mg/dL   Nitrite NEGATIVE NEGATIVE   Leukocytes,Ua  NEGATIVE NEGATIVE    Comment: Performed at [facility] [address], [place], [place] [address]   Review of Systems  Constitutional: Negative for fever.  Gastrointestinal: Negative for nausea and vomiting.  Genitourinary: Positive for flank pain. Negative for difficulty urinating, dysuria and urgency.  Neurological: Positive for headaches.   Physical Exam   Blood pressure 125/77, pulse 91, temperature 98.9 F (37.2 C), temperature source Oral, resp. rate 16, last menstrual period 04/26/2019, SpO2 100 %.  Physical Exam  Constitutional: She is oriented to person, place, and time. She appears well-developed and well-nourished. No distress.  HENT:  Head: Normocephalic.  Eyes: Pupils are equal, round, and reactive to light.  GI: Soft. She exhibits no distension. There is no abdominal tenderness. There is no rebound and no guarding.  Musculoskeletal: Normal range of motion.       Arms:     Comments: No flank pain or tenderness. +relief of pain with deep palpation/ massage.   Neurological: She is alert and oriented to person, place, and time.  Skin: Skin is warm. She is not diaphoretic.  Psychiatric: Her behavior is normal.   MAU Course  Procedures  None  MDM  + Fetal heart tones via doppler  Tylenol 1 gram given PO Heat applied to back Back pain relieved by tylenol. Ha with no relief. Offered Ha cocktail and patient agreeable. Headache cocktail given decadron, benadryl, and reglan given Patient now with 0/10 headache pain and feels significantly better.   Assessment and Plan   A:  1. Muscle pain   2. Back pain in pregnancy   3. [redacted] weeks gestation of pregnancy   4. Pregnancy headache, antepartum     P:  Discharge home in stable condition Rx: Flexeril F/u with OB Return to MAU if symptoms worsen Continue alternating heat and ice. Ok to continue OTC as indicated on the bottle.  Lezlie Lye, NP 09/09/2019 6:13 PM

## 2019-09-09 NOTE — Discharge Instructions (Signed)
First Trimester of Pregnancy  The first trimester of pregnancy is from week 1 until the end of week 13 (months 1 through 3). During this time, your baby will begin to develop inside you. At 6-8 weeks, the eyes and face are formed, and the heartbeat can be seen on ultrasound. At the end of 12 weeks, all the baby's organs are formed. Prenatal care is all the medical care you receive before the birth of your baby. Make sure you get good prenatal care and follow all of your doctor's instructions. Follow these instructions at home: Medicines  Take over-the-counter and prescription medicines only as told by your doctor. Some medicines are safe and some medicines are not safe during pregnancy.  Take a prenatal vitamin that contains at least 600 micrograms (mcg) of folic acid.  If you have trouble pooping (constipation), take medicine that will make your stool soft (stool softener) if your doctor approves. Eating and drinking   Eat regular, healthy meals.  Your doctor will tell you the amount of weight gain that is right for you.  Avoid raw meat and uncooked cheese.  If you feel sick to your stomach (nauseous) or throw up (vomit): ? Eat 4 or 5 small meals a day instead of 3 large meals. ? Try eating a few soda crackers. ? Drink liquids between meals instead of during meals.  To prevent constipation: ? Eat foods that are high in fiber, like fresh fruits and vegetables, whole grains, and beans. ? Drink enough fluids to keep your pee (urine) clear or pale yellow. Activity  Exercise only as told by your doctor. Stop exercising if you have cramps or pain in your lower belly (abdomen) or low back.  Do not exercise if it is too hot, too humid, or if you are in a place of great height (high altitude).  Try to avoid standing for long periods of time. Move your legs often if you must stand in one place for a long time.  Avoid heavy lifting.  Wear low-heeled shoes. Sit and stand up  straight.  You can have sex unless your doctor tells you not to. Relieving pain and discomfort  Wear a good support bra if your breasts are sore.  Take warm water baths (sitz baths) to soothe pain or discomfort caused by hemorrhoids. Use hemorrhoid cream if your doctor says it is okay.  Rest with your legs raised if you have leg cramps or low back pain.  If you have puffy, bulging veins (varicose veins) in your legs: ? Wear support hose or compression stockings as told by your doctor. ? Raise (elevate) your feet for 15 minutes, 3-4 times a day. ? Limit salt in your food. Prenatal care  Schedule your prenatal visits by the twelfth week of pregnancy.  Write down your questions. Take them to your prenatal visits.  Keep all your prenatal visits as told by your doctor. This is important. Safety  Wear your seat belt at all times when driving.  Make a list of emergency phone numbers. The list should include numbers for family, friends, the hospital, and police and fire departments. General instructions  Ask your doctor for a referral to a local prenatal class. Begin classes no later than at the start of month 6 of your pregnancy.  Ask for help if you need counseling or if you need help with nutrition. Your doctor can give you advice or tell you where to go for help.  Do not use hot tubs, steam  rooms, or saunas.  Do not douche or use tampons or scented sanitary pads.  Do not cross your legs for long periods of time.  Avoid all herbs and alcohol. Avoid drugs that are not approved by your doctor.  Do not use any tobacco products, including cigarettes, chewing tobacco, and electronic cigarettes. If you need help quitting, ask your doctor. You may get counseling or other support to help you quit.  Avoid cat litter boxes and soil used by cats. These carry germs that can cause birth defects in the baby and can cause a loss of your baby (miscarriage) or stillbirth.  Visit your dentist.  At home, brush your teeth with a soft toothbrush. Be gentle when you floss. Contact a doctor if:  You are dizzy.  You have mild cramps or pressure in your lower belly.  You have a nagging pain in your belly area.  You continue to feel sick to your stomach, you throw up, or you have watery poop (diarrhea).  You have a bad smelling fluid coming from your vagina.  You have pain when you pee (urinate).  You have increased puffiness (swelling) in your face, hands, legs, or ankles. Get help right away if:  You have a fever.  You are leaking fluid from your vagina.  You have spotting or bleeding from your vagina.  You have very bad belly cramping or pain.  You gain or lose weight rapidly.  You throw up blood. It may look like coffee grounds.  You are around people who have [place] measles, fifth disease, or chickenpox.  You have a very bad headache.  You have shortness of breath.  You have any kind of trauma, such as from a fall or a car accident. Summary  The first trimester of pregnancy is from week 1 until the end of week 13 (months 1 through 3).  To take care of yourself and your unborn baby, you will need to eat healthy meals, take medicines only if your doctor tells you to do so, and do activities that are safe for you and your baby.  Keep all follow-up visits as told by your doctor. This is important as your doctor will have to ensure that your baby is healthy and growing well. This information is not intended to replace advice given to you by your health care provider. Make sure you discuss any questions you have with your health care provider. Document Released: 03/12/2008 Document Revised: 01/15/2019 Document Reviewed: 10/02/2016 Elsevier Patient Education  2020 [employer].

## 2019-09-09 NOTE — MAU Note (Signed)
Pt states she "thinks she has a UTI"  Feels pressure & like she is not emptying her bladder. Pt states lower abdm cramping accompanied with back pain.  Also c/o h/a that is not relieved with Tylenol.  Denies VB.

## 2019-09-10 LAB — CULTURE, OB URINE: Special Requests: NORMAL

## 2019-09-14 LAB — OB RESULTS CONSOLE ABO/RH: RH Type: POSITIVE

## 2019-09-14 LAB — OB RESULTS CONSOLE RUBELLA ANTIBODY, IGM: Rubella: IMMUNE

## 2019-09-14 LAB — OB RESULTS CONSOLE HIV ANTIBODY (ROUTINE TESTING): HIV: NONREACTIVE

## 2019-09-14 LAB — OB RESULTS CONSOLE HEPATITIS B SURFACE ANTIGEN: Hepatitis B Surface Ag: NEGATIVE

## 2019-09-14 LAB — OB RESULTS CONSOLE GC/CHLAMYDIA
Chlamydia: NEGATIVE
Gonorrhea: NEGATIVE

## 2019-09-14 LAB — OB RESULTS CONSOLE RPR: RPR: NONREACTIVE

## 2019-09-14 LAB — OB RESULTS CONSOLE ANTIBODY SCREEN: Antibody Screen: NEGATIVE

## 2019-10-09 NOTE — L&D Delivery Note (Signed)
Delivery Note Danielle Shah was an IOL due to GDM DC G3P0020 at 39.0 weeks  At  28 a viable and healthy female was delivered via  SVD APGAR:9, 9    weight  pending Placenta status: delivered intact, to pathology  Anesthesia:  Epidural Episiotomy:   NA Lacerations:   2nd degree perineal, repaired in usual fashion Suture Repair: 3.0 vicryl Est. Blood Loss (mL):   400 mL. Cytotec 1034mcg given rectally, fundus now firm  Mom to postpartum.  Baby to Couplet care / Skin to Skin.  Danielle Shah 03/28/2020, 6:40 PM

## 2019-10-13 DIAGNOSIS — Z361 Encounter for antenatal screening for raised alphafetoprotein level: Secondary | ICD-10-CM | POA: Diagnosis not present

## 2019-10-19 ENCOUNTER — Ambulatory Visit: Payer: No Typology Code available for payment source | Admitting: Family Medicine

## 2019-11-18 ENCOUNTER — Other Ambulatory Visit (HOSPITAL_COMMUNITY): Payer: Self-pay | Admitting: Obstetrics and Gynecology

## 2019-11-18 DIAGNOSIS — Z3A21 21 weeks gestation of pregnancy: Secondary | ICD-10-CM

## 2019-11-18 DIAGNOSIS — Z363 Encounter for antenatal screening for malformations: Secondary | ICD-10-CM

## 2019-11-23 ENCOUNTER — Other Ambulatory Visit: Payer: Self-pay

## 2019-11-23 ENCOUNTER — Ambulatory Visit (HOSPITAL_COMMUNITY)
Admission: RE | Admit: 2019-11-23 | Discharge: 2019-11-23 | Disposition: A | Discharge status: Home / Self Care | Payer: Medicaid Other | Source: Ambulatory Visit | Attending: Obstetrics and Gynecology | Admitting: Obstetrics and Gynecology

## 2019-11-23 DIAGNOSIS — Z363 Encounter for antenatal screening for malformations: Secondary | ICD-10-CM | POA: Diagnosis not present

## 2019-11-23 DIAGNOSIS — Z3A21 21 weeks gestation of pregnancy: Secondary | ICD-10-CM

## 2019-11-24 DIAGNOSIS — Z3A21 21 weeks gestation of pregnancy: Secondary | ICD-10-CM | POA: Diagnosis not present

## 2019-11-24 DIAGNOSIS — Z3492 Encounter for supervision of normal pregnancy, unspecified, second trimester: Secondary | ICD-10-CM | POA: Diagnosis not present

## 2019-12-01 ENCOUNTER — Other Ambulatory Visit (HOSPITAL_COMMUNITY): Payer: Self-pay | Admitting: Obstetrics and Gynecology

## 2019-12-22 DIAGNOSIS — O368199 Decreased fetal movements, unspecified trimester, other fetus: Secondary | ICD-10-CM | POA: Diagnosis not present

## 2019-12-22 DIAGNOSIS — Z3492 Encounter for supervision of normal pregnancy, unspecified, second trimester: Secondary | ICD-10-CM | POA: Diagnosis not present

## 2019-12-22 DIAGNOSIS — R52 Pain, unspecified: Secondary | ICD-10-CM | POA: Diagnosis not present

## 2019-12-22 DIAGNOSIS — O26899 Other specified pregnancy related conditions, unspecified trimester: Secondary | ICD-10-CM | POA: Diagnosis not present

## 2019-12-22 DIAGNOSIS — Z3A25 25 weeks gestation of pregnancy: Secondary | ICD-10-CM | POA: Diagnosis not present

## 2020-01-20 ENCOUNTER — Encounter: Payer: Self-pay | Admitting: Family Medicine

## 2020-01-26 DIAGNOSIS — O99810 Abnormal glucose complicating pregnancy: Secondary | ICD-10-CM | POA: Diagnosis not present

## 2020-02-09 ENCOUNTER — Other Ambulatory Visit: Payer: Self-pay

## 2020-02-09 ENCOUNTER — Ambulatory Visit (INDEPENDENT_AMBULATORY_CARE_PROVIDER_SITE_OTHER): Payer: Self-pay | Admitting: Pediatrics

## 2020-02-09 DIAGNOSIS — Z7681 Expectant parent(s) prebirth pediatrician visit: Secondary | ICD-10-CM

## 2020-02-14 NOTE — Progress Notes (Signed)
Prenatal counseling for impending newborn done--  Reviewed vaccine policy.  1st child, Currently 32 weeks, Current complications: GDM, Prenatal care initiated 7wks Z76.81

## 2020-02-18 ENCOUNTER — Other Ambulatory Visit: Payer: Self-pay

## 2020-02-18 ENCOUNTER — Encounter: Payer: Medicaid Other | Attending: Obstetrics and Gynecology | Admitting: Skilled Nursing Facility1

## 2020-02-18 ENCOUNTER — Encounter: Payer: Self-pay | Admitting: Skilled Nursing Facility1

## 2020-02-18 DIAGNOSIS — O24410 Gestational diabetes mellitus in pregnancy, diet controlled: Secondary | ICD-10-CM | POA: Diagnosis not present

## 2020-02-18 NOTE — Progress Notes (Signed)
Pt states she is not taking a vitamin D but is taking a prenatal multivitamin: Advised pt to take a vitamin D supplement as her value is 16. Pt states she is about [redacted] weeks along.  Pt state she always feels tired.  Pt state she is a picky eater: Weird about textures and cannot have food touch  Pt states she eats out for all dinners due to not liking to cook.  Pt states she is really thirsty all the time  Pt given Accucheck guide me: exp: 03/09/21  [id number]: about 4 hours post prandial 78. Pt admits to being "hangry" and needing to eat so appt was abbreviated to accommodate that.  Pt became very hot and needed a fan on her feeling better after that.  Education Topics: Balanced meals Proper hydration Sugar content of Gatorade and juice Blood sugar numbers and the relationship with physical activity  Hypoglycemia and how to correct Self monitoring hygiene How to use the meter given  Handout: GDM packet

## 2020-03-03 DIAGNOSIS — Z3A35 35 weeks gestation of pregnancy: Secondary | ICD-10-CM | POA: Diagnosis not present

## 2020-03-03 DIAGNOSIS — O24419 Gestational diabetes mellitus in pregnancy, unspecified control: Secondary | ICD-10-CM | POA: Diagnosis not present

## 2020-03-23 DIAGNOSIS — O24419 Gestational diabetes mellitus in pregnancy, unspecified control: Secondary | ICD-10-CM | POA: Diagnosis not present

## 2020-03-23 DIAGNOSIS — E119 Type 2 diabetes mellitus without complications: Secondary | ICD-10-CM | POA: Diagnosis not present

## 2020-03-24 ENCOUNTER — Telehealth (HOSPITAL_COMMUNITY): Payer: Self-pay | Admitting: *Deleted

## 2020-03-24 ENCOUNTER — Encounter (HOSPITAL_COMMUNITY): Payer: Self-pay | Admitting: *Deleted

## 2020-03-24 NOTE — Telephone Encounter (Signed)
Preadmission screen  

## 2020-03-26 ENCOUNTER — Other Ambulatory Visit (HOSPITAL_COMMUNITY)
Admission: RE | Admit: 2020-03-26 | Discharge: 2020-03-26 | Disposition: A | Discharge status: Home / Self Care | Payer: No Typology Code available for payment source | Source: Ambulatory Visit | Attending: Obstetrics and Gynecology | Admitting: Obstetrics and Gynecology

## 2020-03-26 DIAGNOSIS — Z20822 Contact with and (suspected) exposure to covid-19: Secondary | ICD-10-CM | POA: Insufficient documentation

## 2020-03-26 DIAGNOSIS — Z01812 Encounter for preprocedural laboratory examination: Secondary | ICD-10-CM | POA: Insufficient documentation

## 2020-03-26 LAB — SARS CORONAVIRUS 2 (TAT 6-24 HRS): SARS Coronavirus 2: NEGATIVE

## 2020-03-28 ENCOUNTER — Inpatient Hospital Stay (HOSPITAL_COMMUNITY): Payer: Medicaid Other | Admitting: Anesthesiology

## 2020-03-28 ENCOUNTER — Inpatient Hospital Stay (HOSPITAL_COMMUNITY)
Admission: RE | Admit: 2020-03-28 | Discharge: 2020-03-30 | DRG: 807 | Disposition: A | Discharge status: Home / Self Care | Payer: Medicaid Other | Attending: Obstetrics and Gynecology | Admitting: Obstetrics and Gynecology

## 2020-03-28 ENCOUNTER — Encounter (HOSPITAL_COMMUNITY): Payer: Self-pay | Admitting: Obstetrics and Gynecology

## 2020-03-28 ENCOUNTER — Inpatient Hospital Stay (HOSPITAL_COMMUNITY): Payer: Medicaid Other

## 2020-03-28 ENCOUNTER — Other Ambulatory Visit: Payer: Self-pay

## 2020-03-28 DIAGNOSIS — O24420 Gestational diabetes mellitus in childbirth, diet controlled: Principal | ICD-10-CM | POA: Diagnosis present

## 2020-03-28 DIAGNOSIS — O99344 Other mental disorders complicating childbirth: Secondary | ICD-10-CM | POA: Diagnosis present

## 2020-03-28 DIAGNOSIS — F419 Anxiety disorder, unspecified: Secondary | ICD-10-CM | POA: Diagnosis present

## 2020-03-28 DIAGNOSIS — Z3A39 39 weeks gestation of pregnancy: Secondary | ICD-10-CM | POA: Diagnosis not present

## 2020-03-28 DIAGNOSIS — O24419 Gestational diabetes mellitus in pregnancy, unspecified control: Secondary | ICD-10-CM | POA: Diagnosis not present

## 2020-03-28 DIAGNOSIS — F329 Major depressive disorder, single episode, unspecified: Secondary | ICD-10-CM | POA: Diagnosis present

## 2020-03-28 DIAGNOSIS — O24429 Gestational diabetes mellitus in childbirth, unspecified control: Secondary | ICD-10-CM | POA: Diagnosis not present

## 2020-03-28 DIAGNOSIS — Z87891 Personal history of nicotine dependence: Secondary | ICD-10-CM | POA: Diagnosis not present

## 2020-03-28 LAB — GLUCOSE, CAPILLARY
Glucose-Capillary: 122 mg/dL — ABNORMAL HIGH (ref 70–99)
Glucose-Capillary: 83 mg/dL (ref 70–99)

## 2020-03-28 LAB — TYPE AND SCREEN
ABO/RH(D): O POS
Antibody Screen: NEGATIVE

## 2020-03-28 LAB — CBC
HCT: 34.2 % — ABNORMAL LOW (ref 36.0–46.0)
Hemoglobin: 11.2 g/dL — ABNORMAL LOW (ref 12.0–15.0)
MCH: 31.2 pg (ref 26.0–34.0)
MCHC: 32.7 g/dL (ref 30.0–36.0)
MCV: 95.3 fL (ref 80.0–100.0)
Platelets: 356 10*3/uL (ref 150–400)
RBC: 3.59 MIL/uL — ABNORMAL LOW (ref 3.87–5.11)
RDW: 13.4 % (ref 11.5–15.5)
WBC: 12.8 10*3/uL — ABNORMAL HIGH (ref 4.0–10.5)
nRBC: 0 % (ref 0.0–0.2)

## 2020-03-28 LAB — ABO/RH: ABO/RH(D): O POS

## 2020-03-28 MED ORDER — SOD CITRATE-CITRIC ACID 500-334 MG/5ML PO SOLN
30.0000 mL | ORAL | Status: DC | PRN
  Administered 2020-03-28: 30 mL via ORAL
  Filled 2020-03-28: qty 30

## 2020-03-28 MED ORDER — MISOPROSTOL 200 MCG PO TABS
ORAL_TABLET | ORAL | Status: AC
  Filled 2020-03-28: qty 5

## 2020-03-28 MED ORDER — DIBUCAINE (PERIANAL) 1 % EX OINT: 1.0000 "application " | TOPICAL_OINTMENT | CUTANEOUS | Status: DC | PRN

## 2020-03-28 MED ORDER — OXYCODONE-ACETAMINOPHEN 5-325 MG PO TABS
1.0000 | ORAL_TABLET | ORAL | Status: DC | PRN
  Administered 2020-03-29 – 2020-03-30 (×2): 1 via ORAL
  Filled 2020-03-28 (×2): qty 1

## 2020-03-28 MED ORDER — IBUPROFEN 600 MG PO TABS
600.0000 mg | ORAL_TABLET | Freq: Four times a day (QID) | ORAL | Status: DC
  Administered 2020-03-28 – 2020-03-30 (×6): 600 mg via ORAL
  Filled 2020-03-28 (×6): qty 1

## 2020-03-28 MED ORDER — ACETAMINOPHEN 325 MG PO TABS
650.0000 mg | ORAL_TABLET | ORAL | Status: DC | PRN
  Administered 2020-03-29 (×2): 650 mg via ORAL
  Filled 2020-03-28 (×2): qty 2

## 2020-03-28 MED ORDER — BISACODYL 10 MG RE SUPP: 10.0000 mg | Freq: Every day | RECTAL | Status: DC | PRN

## 2020-03-28 MED ORDER — DIPHENHYDRAMINE HCL 50 MG/ML IJ SOLN: 12.5000 mg | INTRAMUSCULAR | Status: DC | PRN

## 2020-03-28 MED ORDER — LACTATED RINGERS IV SOLN: 500.0000 mL | INTRAVENOUS | Status: DC | PRN

## 2020-03-28 MED ORDER — FENTANYL-BUPIVACAINE-NACL 0.5-0.125-0.9 MG/250ML-% EP SOLN
12.0000 mL/h | EPIDURAL | Status: DC | PRN
  Filled 2020-03-28 (×2): qty 250

## 2020-03-28 MED ORDER — TERBUTALINE SULFATE 1 MG/ML IJ SOLN: 0.2500 mg | Freq: Once | INTRAMUSCULAR | Status: DC | PRN

## 2020-03-28 MED ORDER — FENTANYL CITRATE (PF) 100 MCG/2ML IJ SOLN
INTRAMUSCULAR | Status: AC
  Filled 2020-03-28: qty 2

## 2020-03-28 MED ORDER — OXYTOCIN-SODIUM CHLORIDE 30-0.9 UT/500ML-% IV SOLN: 2.5000 [IU]/h | INTRAVENOUS | Status: DC

## 2020-03-28 MED ORDER — LACTATED RINGERS IV SOLN
500.0000 mL | Freq: Once | INTRAVENOUS | Status: AC
  Administered 2020-03-28: 500 mL via INTRAVENOUS

## 2020-03-28 MED ORDER — ONDANSETRON HCL 4 MG PO TABS: 4.0000 mg | ORAL_TABLET | ORAL | Status: DC | PRN

## 2020-03-28 MED ORDER — BUPIVACAINE HCL (PF) 0.25 % IJ SOLN
INTRAMUSCULAR | Status: DC | PRN
  Administered 2020-03-28: 8 mL via EPIDURAL

## 2020-03-28 MED ORDER — PHENYLEPHRINE 40 MCG/ML (10ML) SYRINGE FOR IV PUSH (FOR BLOOD PRESSURE SUPPORT): 80.0000 ug | PREFILLED_SYRINGE | INTRAVENOUS | Status: DC | PRN

## 2020-03-28 MED ORDER — TETANUS-DIPHTH-ACELL PERTUSSIS 5-2.5-18.5 LF-MCG/0.5 IM SUSP: 0.5000 mL | Freq: Once | INTRAMUSCULAR | Status: DC

## 2020-03-28 MED ORDER — LIDOCAINE HCL (PF) 1 % IJ SOLN
INTRAMUSCULAR | Status: DC | PRN
  Administered 2020-03-28: 5 mL via EPIDURAL
  Administered 2020-03-28: 6 mL via EPIDURAL
  Administered 2020-03-28: 5 mL via EPIDURAL
  Administered 2020-03-28: 4 mL via EPIDURAL

## 2020-03-28 MED ORDER — OXYTOCIN BOLUS FROM INFUSION
333.0000 mL | Freq: Once | INTRAVENOUS | Status: AC
  Administered 2020-03-28: 333 mL via INTRAVENOUS

## 2020-03-28 MED ORDER — LIDOCAINE HCL (PF) 1 % IJ SOLN: 30.0000 mL | INTRAMUSCULAR | Status: DC | PRN

## 2020-03-28 MED ORDER — EPHEDRINE 5 MG/ML INJ: 10.0000 mg | INTRAVENOUS | Status: DC | PRN

## 2020-03-28 MED ORDER — SODIUM CHLORIDE (PF) 0.9 % IJ SOLN
INTRAMUSCULAR | Status: DC | PRN
  Administered 2020-03-28: 12 mL/h via EPIDURAL

## 2020-03-28 MED ORDER — OXYCODONE-ACETAMINOPHEN 5-325 MG PO TABS: 2.0000 | ORAL_TABLET | ORAL | Status: DC | PRN

## 2020-03-28 MED ORDER — LACTATED RINGERS IV SOLN: INTRAVENOUS | Status: DC

## 2020-03-28 MED ORDER — WITCH HAZEL-GLYCERIN EX PADS: 1.0000 "application " | MEDICATED_PAD | CUTANEOUS | Status: DC | PRN

## 2020-03-28 MED ORDER — ACETAMINOPHEN 325 MG PO TABS: 650.0000 mg | ORAL_TABLET | ORAL | Status: DC | PRN

## 2020-03-28 MED ORDER — COCONUT OIL OIL: 1.0000 "application " | TOPICAL_OIL | Status: DC | PRN

## 2020-03-28 MED ORDER — OXYTOCIN-SODIUM CHLORIDE 30-0.9 UT/500ML-% IV SOLN
1.0000 m[IU]/min | INTRAVENOUS | Status: DC
  Administered 2020-03-28: 2 m[IU]/min via INTRAVENOUS
  Administered 2020-03-28: 4 m[IU]/min via INTRAVENOUS

## 2020-03-28 MED ORDER — FLEET ENEMA 7-19 GM/118ML RE ENEM: 1.0000 | ENEMA | Freq: Every day | RECTAL | Status: DC | PRN

## 2020-03-28 MED ORDER — OXYTOCIN-SODIUM CHLORIDE 30-0.9 UT/500ML-% IV SOLN
INTRAVENOUS | Status: AC
  Filled 2020-03-28: qty 500

## 2020-03-28 MED ORDER — MISOPROSTOL 200 MCG PO TABS
1000.0000 ug | ORAL_TABLET | Freq: Once | ORAL | Status: AC
  Administered 2020-03-28: 1000 ug via RECTAL

## 2020-03-28 MED ORDER — MEDROXYPROGESTERONE ACETATE 150 MG/ML IM SUSP: 150.0000 mg | INTRAMUSCULAR | Status: DC | PRN

## 2020-03-28 MED ORDER — SIMETHICONE 80 MG PO CHEW: 80.0000 mg | CHEWABLE_TABLET | ORAL | Status: DC | PRN

## 2020-03-28 MED ORDER — OXYCODONE-ACETAMINOPHEN 5-325 MG PO TABS: 1.0000 | ORAL_TABLET | ORAL | Status: DC | PRN

## 2020-03-28 MED ORDER — PRENATAL MULTIVITAMIN CH
1.0000 | ORAL_TABLET | Freq: Every day | ORAL | Status: DC
  Administered 2020-03-29: 1 via ORAL
  Filled 2020-03-28: qty 1

## 2020-03-28 MED ORDER — BENZOCAINE-MENTHOL 20-0.5 % EX AERO
1.0000 "application " | INHALATION_SPRAY | CUTANEOUS | Status: DC | PRN
  Administered 2020-03-28 – 2020-03-30 (×3): 1 via TOPICAL
  Filled 2020-03-28 (×3): qty 56

## 2020-03-28 MED ORDER — FLEET ENEMA 7-19 GM/118ML RE ENEM: 1.0000 | ENEMA | RECTAL | Status: DC | PRN

## 2020-03-28 MED ORDER — ONDANSETRON HCL 4 MG/2ML IJ SOLN: 4.0000 mg | Freq: Four times a day (QID) | INTRAMUSCULAR | Status: DC | PRN

## 2020-03-28 MED ORDER — SENNOSIDES-DOCUSATE SODIUM 8.6-50 MG PO TABS
2.0000 | ORAL_TABLET | ORAL | Status: DC
  Administered 2020-03-29 (×2): 2 via ORAL
  Filled 2020-03-28 (×2): qty 2

## 2020-03-28 MED ORDER — DIPHENHYDRAMINE HCL 25 MG PO CAPS: 25.0000 mg | ORAL_CAPSULE | Freq: Four times a day (QID) | ORAL | Status: DC | PRN

## 2020-03-28 MED ORDER — BUSPIRONE HCL 5 MG PO TABS
15.0000 mg | ORAL_TABLET | Freq: Two times a day (BID) | ORAL | Status: DC
  Administered 2020-03-28 – 2020-03-30 (×4): 15 mg via ORAL
  Filled 2020-03-28 (×2): qty 3
  Filled 2020-03-28: qty 1
  Filled 2020-03-28 (×2): qty 3

## 2020-03-28 MED ORDER — ZOLPIDEM TARTRATE 5 MG PO TABS: 5.0000 mg | ORAL_TABLET | Freq: Every evening | ORAL | Status: DC | PRN

## 2020-03-28 MED ORDER — ONDANSETRON HCL 4 MG/2ML IJ SOLN: 4.0000 mg | INTRAMUSCULAR | Status: DC | PRN

## 2020-03-28 MED ORDER — FENTANYL CITRATE (PF) 100 MCG/2ML IJ SOLN
INTRAMUSCULAR | Status: DC | PRN
  Administered 2020-03-28: 100 ug via EPIDURAL

## 2020-03-28 MED ORDER — BUTORPHANOL TARTRATE 1 MG/ML IJ SOLN: 2.0000 mg | INTRAMUSCULAR | Status: DC | PRN

## 2020-03-28 NOTE — Anesthesia Procedure Notes (Signed)
Epidural Patient location during procedure: OB  Staffing Anesthesiologist: Lidia Collum, MD Performed: anesthesiologist   Preanesthetic Checklist Completed: patient identified, IV checked, risks and benefits discussed, monitors and equipment checked, pre-op evaluation and timeout performed  Epidural Patient position: sitting Prep: DuraPrep Patient monitoring: heart rate, continuous pulse ox and blood pressure Approach: midline Location: L2-L3 Injection technique: LOR air  Needle:  Needle type: Tuohy  Needle gauge: 17 G Needle length: 9 cm Needle insertion depth: 6.5 cm Catheter type: closed end flexible Catheter size: 19 Gauge Catheter at skin depth: 10.5 cm  Assessment Events: blood not aspirated, injection not painful, no injection resistance, no paresthesia and negative IV test  Additional Notes Replacement epidural Reason for block:procedure for pain

## 2020-03-28 NOTE — Anesthesia Preprocedure Evaluation (Signed)
Anesthesia Evaluation  Patient identified by MRN, date of birth, ID band Patient awake    Reviewed: Allergy & Precautions, H&P , NPO status , Patient's Chart, lab work & pertinent test results  History of Anesthesia Complications Negative for: history of anesthetic complications  Airway Mallampati: II  TM Distance: >3 FB Neck ROM: full    Dental no notable dental hx.    Pulmonary neg pulmonary ROS, former smoker,    Pulmonary exam normal        Cardiovascular negative cardio ROS Normal cardiovascular exam Rhythm:regular Rate:Normal     Neuro/Psych negative neurological ROS  negative psych ROS   GI/Hepatic negative GI ROS, Neg liver ROS,   Endo/Other  diabetes, Gestational  Renal/GU      Musculoskeletal   Abdominal   Peds  Hematology  (+) Blood dyscrasia, anemia ,   Anesthesia Other Findings   Reproductive/Obstetrics (+) Pregnancy                            Anesthesia Physical Anesthesia Plan  ASA: II  Anesthesia Plan: Epidural   Post-op Pain Management:    Induction:   PONV Risk Score and Plan:   Airway Management Planned:   Additional Equipment:   Intra-op Plan:   Post-operative Plan:   Informed Consent: I have reviewed the patients History and Physical, chart, labs and discussed the procedure including the risks, benefits and alternatives for the proposed anesthesia with the patient or authorized representative who has indicated his/her understanding and acceptance.       Plan Discussed with:   Anesthesia Plan Comments:        Anesthesia Quick Evaluation

## 2020-03-28 NOTE — Anesthesia Procedure Notes (Signed)
Epidural Patient location during procedure: OB Start time: 03/28/2020 2:17 PM End time: 03/28/2020 2:27 PM  Staffing Anesthesiologist: Lidia Collum, MD Performed: anesthesiologist   Preanesthetic Checklist Completed: patient identified, IV checked, risks and benefits discussed, monitors and equipment checked, pre-op evaluation and timeout performed  Epidural Patient position: sitting Prep: DuraPrep Patient monitoring: heart rate, continuous pulse ox and blood pressure Approach: midline Location: L3-L4 Injection technique: LOR air  Needle:  Needle type: Tuohy  Needle gauge: 17 G Needle length: 9 cm Needle insertion depth: 7 cm Catheter type: closed end flexible Catheter size: 19 Gauge Catheter at skin depth: 12 cm Test dose: negative  Assessment Events: blood not aspirated, injection not painful, no injection resistance, no paresthesia and negative IV test  Additional Notes Reason for block:procedure for pain

## 2020-03-28 NOTE — Lactation Note (Addendum)
This note was copied from a baby's chart. Lactation Consultation Note  Patient Name: Danielle Shah [id number] Date: 03/28/2020 Reason for consult: Initial assessment;Primapara  Mom with hx anxiety sand depression.  Rn in room taking baby Danielle Mila's temp while at breast.  Dad looking for cell phone charger. Mom reports she is having a hard time with all these distractions.  Mom has Mia Positioned in cross cradle on right breast however she is not holding breast and Mila's head somewhat buried in breast.  Tried to readjust position slightly.  Showed mom she needed a goood amount of room to swallow as well.   Since mom seemed overwhelmed at this time.  Briefly discussed hand expression and positioning.  Urged to feed on cue and 8 or more times day based on cue.  Mom reports that she has already fed three times and asled if that was too much.  Reemphasized 8 or more feeds in 24 hours based on cue that and should feed her when cuing.  Reviewed [facility] handout.  Left it.  Urged mom to call lactation as needed. Maternal Data    Feeding Feeding Type: Breast Fed  LATCH Score Latch: Grasps breast easily, tongue down, lips flanged, rhythmical sucking.  Audible Swallowing: A few with stimulation  Type of Nipple: Everted at rest and after stimulation (did not observe/already latched/per mom)  Comfort (Breast/Nipple): Soft / non-tender  Hold (Positioning): Assistance needed to correctly position infant at breast and maintain latch. (RN asssited)  LATCH Score: 8  Interventions Interventions: Breast feeding basics reviewed  Lactation Tools Discussed/Used     Consult Status Consult Status: Follow-up Date: 03/29/20 Follow-up type: In-patient    [facility] Caul 03/28/2020, 8:44 PM

## 2020-03-28 NOTE — H&P (Signed)
Danielle Shah is a 34 y.o. female  G3P0020 at 39.0 weeks IOL GDM Diet controlled  OB History    Gravida  3   Para  0   Term      Preterm      AB  2   Living  0     SAB  1   TAB  1   Ectopic      Multiple      Live Births             Past Medical History:  Diagnosis Date   Anemia    Anxiety    Depression    Diabetes mellitus without complication ([place])    Fibroid    Gestational diabetes    Obesity (BMI 30.0-34.9)    Past Surgical History:  Procedure Laterality Date   EYE SURGERY Right 2003   WISDOM TOOTH EXTRACTION  2004   Family History: family history includes Arthritis in her mother; Heart disease in her mother; Hyperlipidemia in her mother; Hypertension in her mother. Social History:  reports that she has quit smoking. Her smoking use included cigarettes. She has never used smokeless tobacco. She reports previous alcohol use. She reports previous drug use. Drug: Marijuana.     Maternal Diabetes: Yes:  Diabetes Type:  Diet controlled Genetic Screening: Normal Maternal Ultrasounds/Referrals: Normal Fetal Ultrasounds or other Referrals:  None Maternal Substance Abuse:  No Significant Maternal Medications:  None Significant Maternal Lab Results:  Group B Strep negative Other Comments:  None  Review of Systems  Constitutional: Negative.   HENT: Negative.   Eyes: Negative.   Respiratory: Negative.   Cardiovascular: Negative.   Gastrointestinal: Negative.   Endocrine: Negative.   Genitourinary: Negative.   Musculoskeletal: Negative.   Skin: Negative.   Allergic/Immunologic: Negative.   Neurological: Negative.   Hematological: Negative.   Psychiatric/Behavioral: Negative.    History   Blood pressure 117/90, pulse (!) 105, temperature 98.2 F (36.8 C), last menstrual period 04/26/2019. Maternal Exam:  Introitus: Normal vulva.   Physical Exam  Vitals reviewed. Constitutional: She is oriented to person, place, and time.  HENT:   Head: Normocephalic and atraumatic.  Nose: Nose normal.  Eyes: Pupils are equal, round, and reactive to light. Conjunctivae are normal.  Cardiovascular: Normal rate and regular rhythm.  Respiratory: Effort normal.  GI: Normal appearance.  Genitourinary:    Vulva normal.   Musculoskeletal:        General: Normal range of motion.     Cervical back: Normal range of motion and neck supple.  Neurological: She is alert and oriented to person, place, and time.  Skin: Skin is warm and dry.  Psychiatric: Her behavior is normal. Mood, judgment and thought content normal.   SVE 1-2/75/-2 IBOW Anterior, soft Bedside US confirms vertex Cat I tracing Prenatal labs: ABO, Rh: O/Positive/-- (12/07 0000) Antibody: Negative (12/07 0000) Rubella: Immune (12/07 0000) RPR: Nonreactive (12/07 0000)  HBsAg: Negative (12/07 0000)  HIV: Non-reactive (12/07 0000)  GBS:   Negative  Assessment/Plan: Admit for IOL Begin pitocin per protocol Desire epidural during labor POC reviewed with Dr. Haig Prophet B Dolphus Shah 03/28/2020, 9:15 AM

## 2020-03-28 NOTE — Anesthesia Postprocedure Evaluation (Signed)
Anesthesia Post Note  Patient: JANNINE ABREU  Procedure(s) Performed: AN AD [place]     Patient location during evaluation: Mother Baby Anesthesia Type: Epidural Level of consciousness: awake and alert Pain management: pain level controlled Vital Signs Assessment: post-procedure vital signs reviewed and stable Respiratory status: spontaneous breathing, nonlabored ventilation and respiratory function stable Cardiovascular status: stable Postop Assessment: no headache, no backache and epidural receding Anesthetic complications: no   No complications documented.  Last Vitals:  Vitals:   03/28/20 1821 03/28/20 1831  BP: 124/85 124/75  Pulse: 96 94  Resp:  16  Temp:    SpO2:      Last Pain:  Vitals:   03/28/20 1815  TempSrc:   PainSc: 0-No pain   Pain Goal:                Epidural/Spinal Function Cutaneous sensation: Tingles (03/28/20 1815), Patient able to flex knees: Yes (03/28/20 1815), Patient able to lift hips off bed: Yes (03/28/20 1815)  Alynn Ellithorpe

## 2020-03-28 NOTE — Anesthesia Postprocedure Evaluation (Signed)
Anesthesia Post Note  Patient: Danielle Shah  Procedure(s) Performed: AN AD HOC [facility]OR EPIDURAL     Anesthesia Post Evaluation No complications documented.  Last Vitals:  Vitals:   03/28/20 1821 03/28/20 1831  BP: 124/85 124/75  Pulse: 96 94  Resp:  16  Temp:    SpO2:      Last Pain:  Vitals:   03/28/20 1815  TempSrc:   PainSc: 0-No pain   Pain Goal:                Epidural/Spinal Function Cutaneous sensation: Tingles (03/28/20 1815), Patient able to flex knees: Yes (03/28/20 1815), Patient able to lift hips off bed: Yes (03/28/20 1815)  Letti Towell

## 2020-03-28 NOTE — Progress Notes (Addendum)
Danielle Shah is a 34 y.o. G3P0020 at [redacted]w[redacted]d IOL GDM DC Pitocin infusing 38mU Epidural currently being replaced    Subjective: Pt starting to get relief after epidural replacement  Objective: BP (!) 136/96   Pulse 90   Temp 97.6 F (36.4 C) (Oral)   Resp 18   Ht 5\' 3"  (1.6 m)   Wt 83 kg   LMP 04/26/2019 (Exact Date)   SpO2 100%   BMI 32.42 kg/m  No intake/output data recorded. No intake/output data recorded.  FHT: Cat II tracing, 125 baseline, variable decels, good recovery no accels  UC:   regular, every 2-3 minutes SVE:  9/90/-1 AROM 1331 clear fluid Labs: Lab Results  Component Value Date   WBC 12.8 (H) 03/28/2020   HGB 11.2 (L) 03/28/2020   HCT 34.2 (L) 03/28/2020   MCV 95.3 03/28/2020   PLT 356 03/28/2020    Assessment / Plan: Induction of labor due to gestational diabetes,  progressing well on pitocin  Labor: Progressing normally. AROM complete Preeclampsia:  no signs or symptoms of toxicity Fetal Wellbeing:  Cat II Pain Control:  Epidural now replaced I/D:  n/a Anticipated MOD:  NSVD  Khori Underberg B [place] Newmann 03/28/2020, 5:02 PM

## 2020-03-28 NOTE — Progress Notes (Signed)
Danielle Shah is a 34 y.o. G3P0020 at [redacted]w[redacted]d IOL GDM DC Pitocin infusing 62mU    Subjective: Pt coping well, only mild cramping  Objective: BP 117/90   Pulse 95   Temp 97.9 F (36.6 C) (Oral)   Resp 18   LMP 04/26/2019 (Exact Date)  No intake/output data recorded. No intake/output data recorded.  FHT:  FHR: 145 bpm, variability: moderate,  accelerations:  Abscent,  decelerations:  Absent UC:   irregular, every 2-4 minutes SVE:   Dilation: 3.5 Effacement (%): 80 Station: -2 Exam by:: Danielle Shah  AROM 1331 clear fluid Labs: Lab Results  Component Value Date   WBC 12.8 (H) 03/28/2020   HGB 11.2 (L) 03/28/2020   HCT 34.2 (L) 03/28/2020   MCV 95.3 03/28/2020   PLT 356 03/28/2020    Assessment / Plan: Induction of labor due to gestational diabetes,  progressing well on pitocin  Labor: Progressing normally. AROM complete Preeclampsia:  no signs or symptoms of toxicity Fetal Wellbeing:  Category I Pain Control:  Eventual desires for epidural I/D:  n/a Anticipated MOD:  NSVD  Danielle Shah Danielle Shah 03/28/2020, 1:37 PM

## 2020-03-29 ENCOUNTER — Encounter (HOSPITAL_COMMUNITY): Payer: Self-pay | Admitting: Obstetrics and Gynecology

## 2020-03-29 LAB — GLUCOSE, CAPILLARY: Glucose-Capillary: 80 mg/dL (ref 70–99)

## 2020-03-29 LAB — RPR: RPR Ser Ql: NONREACTIVE

## 2020-03-29 NOTE — Plan of Care (Signed)

## 2020-03-29 NOTE — Lactation Note (Signed)
This note was copied from a baby's chart. Lactation Consultation Note  Patient Name: Girl Britzy Graul [id number] Date: 03/29/2020 Reason for consult: Follow-up assessment Type of Endocrine Disorder?: Diabetes  Follow-up assessment of 20-hours baby girl, breastfeed exclusively with 1.48% weight loss.  Baby is resting in father's arms upon arrival. Mother reports she is ready to eat. Mother stated baby is breastfeeding well but she is concern about baby's latch on left breast. Encouraged mother to call when ready to feed baby for assistance with deep latch to improve breastfeeding session  Parents verbalized agreement and are aware to call lactation.     Feeding Feeding Type: Breast Fed  LATCH Score Latch: Grasps breast easily, tongue down, lips flanged, rhythmical sucking.  Audible Swallowing: A few with stimulation  Type of Nipple: Everted at rest and after stimulation  Comfort (Breast/Nipple): Soft / non-tender  Hold (Positioning): Assistance needed to correctly position infant at breast and maintain latch.  LATCH Score: 8  Interventions Interventions: Breast feeding basics reviewed   Consult Status Consult Status: Follow-up Date: 03/30/20 Follow-up type: In-patient    Qadir Folks A Higuera Ancidey 03/29/2020, 2:41 PM

## 2020-03-29 NOTE — Progress Notes (Signed)
MOB was referred for history of depression/anxiety. * Referral screened out by Clinical Social Worker because none of the following criteria appear to apply: ~ History of anxiety/depression during this pregnancy, or of post-partum depression following prior delivery. ~ Diagnosis of anxiety and/or depression within last 3 years OR * MOB's symptoms currently being treated with medication and/or therapy. Per further chart review, it appears that MOB has active prescription for Celexa, however received Buspar on yesterday.    Please contact the Clinical Social Worker if needs arise, by [facility] request, or if MOB scores greater than 9/yes to question 10 on Edinburgh Postpartum Depression Screen.    Virgie Dad Patte Winkel, MSW, LCSW [facility] [place] [phone]

## 2020-03-29 NOTE — Addendum Note (Signed)
Addendum  created 03/29/20 1750 by Janeece Riggers, MD   Intraprocedure Staff edited

## 2020-03-29 NOTE — Lactation Note (Signed)
This note was copied from a baby's chart. Lactation Consultation Note  Patient Name: Danielle Shah [id number] Date: 03/29/2020 Reason for consult: Follow-up assessment;Mother's request;Difficult latch;Primapara;1st time breastfeeding;Term Type of Endocrine Disorder?: Diabetes   Baby is 27 hours old with 1.48% weight loss. Baby is sleeping in mother's arms upon arrival. Mother states breastfeeding is going well when she is latching baby on right breast. Mother reports it is difficult to latch onto left breast and baby seems to get a shallow latch, hurting her nipple.   Baby is showing hunger cues upon arrival. Encouraged mother to show how she has been latching baby to left breast. Baby does not seem to get a deep latch and mother reports pain. Encouraged mother to try football hold, using pillows for support. Mother verbalized agreement and repositioned herself. After a few attempts, mother was able to latch baby reporting no discomfort. Mother showed how she was able to massage breast. Baby was suckling with flanged lips and audible swallows were observed. Baby was still breastfeeding after 15 minutes when [place] left the room.   Encouraged parents to call for more assistance or for any questions. All questions answered at this time.   Maternal Data Has patient been taught Hand Expression?: Yes  Feeding Feeding Type: Breast Fed  LATCH Score Latch: Grasps breast easily, tongue down, lips flanged, rhythmical sucking.  Audible Swallowing: Spontaneous and intermittent  Type of Nipple: Everted at rest and after stimulation  Comfort (Breast/Nipple): Soft / non-tender  Hold (Positioning): Assistance needed to correctly position infant at breast and maintain latch.  LATCH Score: 9  Interventions Interventions: Breast feeding basics reviewed;Assisted with latch;Skin to skin;Hand express;Adjust position;Support pillows;Position options   Consult Status Consult Status: Follow-up Date:  03/30/20 Follow-up type: In-patient    Jaedah Lords A Higuera Ancidey 03/29/2020, 6:06 PM

## 2020-03-29 NOTE — Addendum Note (Signed)
Addendum  created 03/29/20 1201 by Asher Muir, CRNA   Charge Capture section accepted, Clinical Note Signed

## 2020-03-29 NOTE — Progress Notes (Signed)
PPD# 1 SVD w/ 2nd degree perineal laceration Information for the patient's newborn:  Danielle Shah, Danielle Shah [[id number]]  female    Baby Name [name]   S:   Reports feeling good Tolerating PO fluid and solids No nausea or vomiting Bleeding is light, no clots Pain controlled with  PO meds Up ad lib / ambulatory / voiding w/o difficulty Feeding: Breast    O:   VS: BP 123/77    Pulse 83    Temp 97.6 F (36.4 C) (Oral)    Resp 18    Ht 5\' 3"  (1.6 m)    Wt 83 kg    LMP 04/26/2019 (Exact Date)    SpO2 100%    Breastfeeding Unknown    BMI 32.42 kg/m   LABS:  Recent Labs    03/28/20 0847  WBC 12.8*  HGB 11.2*  PLT 356   Blood type: --/--/O POS, O POS Performed at [facility] [address] [place], [place] [address]  [phone]) Rubella: Immune (12/07 0000)                      I&O: Intake/Output      06/21 0701 - 06/22 0700 06/22 0701 - 06/23 0700   Blood 314    Total Output 314    Net -314         Urine Occurrence 1 x      Physical Exam: Alert and oriented X3 Lungs: Clear and unlabored Heart: regular rate and rhythm / no mumurs Abdomen: soft, non-tender, non-distended  Fundus: firm, non-tender Perineum: well-approximated Lochia: appropriate Extremities: no edema, no calf pain or tenderness    A:  PPD # 1  Normal exam  P:  Routine post partum orders  Anticipate D/C on 6/23   Plan reviewed w/ Dr. Effie Shy, MSN, CNM 03/29/2020, 9:09 AM

## 2020-03-29 NOTE — Anesthesia Postprocedure Evaluation (Signed)
Anesthesia Post Note  Patient: Danielle Shah  Procedure(s) Performed: AN AD [place]     Patient location during evaluation: Mother Baby Anesthesia Type: Epidural Level of consciousness: awake Pain management: satisfactory to patient Vital Signs Assessment: post-procedure vital signs reviewed and stable Respiratory status: spontaneous breathing Cardiovascular status: stable Anesthetic complications: no   No complications documented.  Last Vitals:  Vitals:   03/29/20 0432 03/29/20 1100  BP: 123/77 124/85  Pulse: 83 90  Resp: 18 20  Temp: 36.4 C 37.1 C  SpO2:  99%    Last Pain:  Vitals:   03/29/20 1100  TempSrc: Oral  PainSc:    Pain Goal:                   [employer]

## 2020-03-30 MED ORDER — SERTRALINE HCL 50 MG PO TABS
50.0000 mg | ORAL_TABLET | Freq: Every day | ORAL | 0 refills | Status: DC
Start: 2020-03-30 — End: 2020-04-01

## 2020-03-30 MED ORDER — IBUPROFEN 600 MG PO TABS: 600.0000 mg | ORAL_TABLET | Freq: Four times a day (QID) | ORAL | 0 refills | Status: DC

## 2020-03-30 NOTE — Lactation Note (Signed)
This note was copied from a baby's chart. Lactation Consultation Note  Patient Name: Danielle Shah [id number] Date: 03/30/2020 Reason for consult: Follow-up assessment  P1 mother whose infant is now 80 hours old.  This is a term baby at 39+0 weeks.  Mother was standing at the bedside attempting to latch baby when I arrived.  Offered to assist and mother agreeable.  She got in a comfortable position in the bed with good pillow support.  Re-educated mother on the proper positioning for cross cradle hold and assisted baby to latch easily.  Mother's breasts are soft and non tender and nipples are everted and intact.  Baby began sucking and soon fell asleep.  Demonstrated effective stimulation to keep her sucking and got father involved with assisting.  Demonstrated breast compressions.  Explained nutritive vs non nutritive sucking.  Explained the importance of keeping her awake and feeding.  Parents verbalized understanding.  Engorgement prevention/treatment discussed.  Manual pump provided and demonstration given.  Allowed mother practice in putting pump together.  #24 flange size changed to a #27 for greater fit and comfort.  Mother has her sister's DEBP for home use.  She has our OP phone number for questions after discharge.  Father supportive.  RN updated.   Maternal Data    Feeding Feeding Type: Breast Fed  LATCH Score                   Interventions    Lactation Tools Discussed/Used     Consult Status Consult Status: Complete Date: 03/30/20 Follow-up type: Call as needed    Manny Vitolo R Berton Butrick 03/30/2020, 9:23 AM

## 2020-03-30 NOTE — Discharge Summary (Signed)
SVD OB Discharge Summary     Patient Name: Danielle Shah DOB: 22-May-1986 MRN: [id number]  Date of admission: 03/28/2020 Delivering MD: Beatrix Fetters B  Date of delivery: 03/28/2020 Type of delivery: SVD  Newborn Data: Sex: Baby Female  Live born female  Birth Weight: 7 lb 2.1 oz (3235 g) APGAR: 61, 9  Newborn Delivery   Birth date/time: 03/28/2020 17:52:00 Delivery type: Vaginal, Spontaneous      Feeding: breast Infant being discharge to home with mother in stable condition.   Admitting diagnosis: Normal labor and delivery [O80] Intrauterine pregnancy: [redacted]w[redacted]d     Secondary diagnosis:  Active Problems:   Normal labor and delivery   Normal postpartum course   SVD (spontaneous vaginal delivery)                                Complications: None                                                              Intrapartum Procedures: spontaneous vaginal delivery Postpartum Procedures: none Complications-Operative and Postpartum: 2nd degree perineal laceration Augmentation: AROM and Pitocin   History of Present Illness: Danielle Shah is a 34 y.o. female, [id number], who presents at [redacted]w[redacted]d weeks gestation. The patient has been followed at  [facility]  Her pregnancy has been complicated by:  Patient Active Problem List   Diagnosis Date Noted  . Normal postpartum course 03/30/2020  . SVD (spontaneous vaginal delivery) 03/30/2020  . Normal labor and delivery 03/28/2020  . Irregular menstrual bleeding 08/04/2018  . ADHD (attention deficit hyperactivity disorder) 01/31/2018  . Anxiety and depression 12/14/2017  . Depression, major, single episode, severe ([place]) 12/14/2017  . Anxiety 12/14/2017  . Hyperlipidemia, mild 11/29/2013  . Overweight (BMI 25.0-29.9) 11/11/2009  . 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 DERMATITIS, ATOPIC 10/09/2008  . DYSPEPSIA 10/07/2008  . Allergic rhinitis 04/23/2008    Hospital course:  Onset  of Labor With Vaginal Delivery      35 y.o. yo [id number] at [redacted]w[redacted]d was admitted s/p on 03/28/2020. Patient had an uncomplicated labor course as follows:  Membrane Rupture Time/Date: 1:31 PM ,03/28/2020   Delivery Method:Vaginal, Spontaneous  Episiotomy: None  Lacerations:  2nd degree  Patient had an uncomplicated postpartum course.  She is ambulating, tolerating a regular diet, passing flatus, and urinating well. Patient is discharged home in stable condition on 03/30/20.  Newborn Data: Birth date:03/28/2020  Birth time:5:52 PM  Gender:Female  Living status:Living  Apgars:9 ,9  Weight:3235 g    Postpartum Day # 2 : S/P NSVD due to IOL for GDM @39  weeks, diet controlled, cbg 80s postprandial and postpartum, no meds, will f/u at 6 weeks PP for 2HGTT to check for overt DM. Pt had HSV+ no lesions, and obesity. Endured a 2nd laceration which was repaired and ebl was 352mls, hgb prior to delivery was stable at 11.2, was never drawn post delivery. Patient up ad lib, denies syncope or dizziness. Reports consuming regular diet without issues and denies N/V. Patient reports 0 bowel movement + passing flatus.  Denies issues with urination and reports bleeding is "lighter."  Patient is breastfeeding and reports going well.  Desires undecided  for postpartum contraception.  Pain is being appropriately managed with use of po meds. Pt had h/o anxiety/depression/ADHD not medication during this pregnancy, took citalopram prior to pregnancy, pt was found in room crying and having anxiety about need to leave the hospital, talked with pt pt calmed down. Spoke with pt about medication  Management, pt was prescribed 15mg  burspar daily and received one dose, it is not recommended to start with burspar for anxiety and depression, buspar it typically an additive to an SSRI, SSRI is first line treatment recommendation for depression and axiety, Zoloft is well studied, and o medication in known top be safe in pregnancy but zoloft is  one of the safest for breastfeeding and pregnancy for pregnancy and breastfeeding. Zoloft will be in breastmilk but the RID is low. Pt verbalized understanding, denies SI/HI, EPDS score of 5, will have one week mood check in one week with [facility],  Report SI./HI, and increase zoloft form 50mg  daily to 75 mg ion one week, plan to further increase to 100mg  for target dose, unless pt feels relief of s/sx prior, report side effects.   Physical exam  Vitals:   03/29/20 1815 03/29/20 2020 03/29/20 2210 03/30/20 0530  BP: 120/84 119/81 122/78 118/79  Pulse: 85 84 93 86  Resp: 18 18 18 16   Temp:  97.8 F (36.6 C) 98.4 F (36.9 C) 98.5 F (36.9 C)  TempSrc:  Oral Oral Oral  SpO2:  100%  100%  Weight:      Height:       General: alert, cooperative and no distress Lochia: appropriate Uterine Fundus: firm Perineum: approximate.  DVT Evaluation: No evidence of DVT seen on physical exam. Negative Homan's sign. No cords or calf tenderness. No significant calf/ankle edema.  Labs: Lab Results  Component Value Date   WBC 12.8 (H) 03/28/2020   HGB 11.2 (L) 03/28/2020   HCT 34.2 (L) 03/28/2020   MCV 95.3 03/28/2020   PLT 356 03/28/2020   CMP Latest Ref Rng & Units 10/14/2013  Glucose 70 - 99 mg/dL 83  BUN 6 - 23 mg/dL 7  Creatinine 0.50 - 1.10 mg/dL 0.64  Sodium 135 - 145 mEq/L 137  Potassium 3.5 - 5.3 mEq/L 5.1  Chloride 96 - 112 mEq/L 104  CO2 19 - 32 mEq/L 24  Calcium 8.4 - 10.5 mg/dL 10.2  Total Protein 6.0 - 8.3 g/dL -  Total Bilirubin 0.3 - 1.2 mg/dL -  Alkaline Phos 39 - 117 U/L -  AST 0 - 37 U/L -  ALT 0 - 35 U/L -    Date of discharge: 03/30/2020 Discharge Diagnoses: Term Pregnancy-delivered Discharge instruction: per After Visit Summary and "Baby and Me Booklet".  After visit meds:   Activity:           unrestricted and pelvic rest Advance as tolerated. Pelvic rest for 6 weeks.  Diet:                routine Medications: PNV, Ibuprofen and zoloft 50mg  Postpartum  contraception: Undecided Condition:  Pt discharge to home with baby in stable condition Depression?Anxiety: One week mood check, zoloft 50mg  see note above.  GDM: see note above f/u 6 weeks PP for 2 H GTT.   Meds: Allergies as of 03/30/2020      Reactions   Penicillins Hives      Medication List    STOP taking these medications   citalopram 20 MG tablet Commonly known as: CELEXA   cyclobenzaprine 10 MG tablet  Commonly known as: FLEXERIL   pantoprazole 20 MG tablet Commonly known as: PROTONIX     TAKE these medications   ibuprofen 600 MG tablet Commonly known as: ADVIL Take 1 tablet (600 mg total) by mouth every 6 (six) hours.   sertraline 50 MG tablet Commonly known as: Zoloft Take 1 tablet (50 mg total) by mouth daily.       Discharge Follow Up:   Follow-up [facility]. Schedule an appointment as soon as possible for a visit in 6 week(s).   Specialty: Obstetrics and Gynecology Contact information: [address] [place] [phone] [place], NP-C, CNM 03/30/2020, 2:42 PM  Noralyn Pick, [place]

## 2020-03-31 LAB — SURGICAL PATHOLOGY

## 2020-04-01 ENCOUNTER — Other Ambulatory Visit: Payer: Self-pay

## 2020-04-01 ENCOUNTER — Encounter (HOSPITAL_COMMUNITY): Payer: Self-pay | Admitting: Obstetrics & Gynecology

## 2020-04-01 ENCOUNTER — Inpatient Hospital Stay (HOSPITAL_COMMUNITY)
Admission: AD | Admit: 2020-04-01 | Discharge: 2020-04-01 | Disposition: A | Discharge status: Home / Self Care | Payer: Medicaid Other | Attending: Obstetrics & Gynecology | Admitting: Obstetrics & Gynecology

## 2020-04-01 DIAGNOSIS — O135 Gestational [pregnancy-induced] hypertension without significant proteinuria, complicating the puerperium: Secondary | ICD-10-CM | POA: Diagnosis not present

## 2020-04-01 DIAGNOSIS — R945 Abnormal results of liver function studies: Secondary | ICD-10-CM | POA: Diagnosis not present

## 2020-04-01 DIAGNOSIS — R102 Pelvic and perineal pain: Secondary | ICD-10-CM | POA: Insufficient documentation

## 2020-04-01 DIAGNOSIS — R748 Abnormal levels of other serum enzymes: Secondary | ICD-10-CM | POA: Diagnosis not present

## 2020-04-01 DIAGNOSIS — O99893 Other specified diseases and conditions complicating puerperium: Secondary | ICD-10-CM

## 2020-04-01 LAB — COMPREHENSIVE METABOLIC PANEL
ALT: 46 U/L — ABNORMAL HIGH (ref 0–44)
AST: 50 U/L — ABNORMAL HIGH (ref 15–41)
Albumin: 2.5 g/dL — ABNORMAL LOW (ref 3.5–5.0)
Alkaline Phosphatase: 130 U/L — ABNORMAL HIGH (ref 38–126)
Anion gap: 9 (ref 5–15)
BUN: 7 mg/dL (ref 6–20)
CO2: 22 mmol/L (ref 22–32)
Calcium: 8.7 mg/dL — ABNORMAL LOW (ref 8.9–10.3)
Chloride: 108 mmol/L (ref 98–111)
Creatinine, Ser: 0.55 mg/dL (ref 0.44–1.00)
GFR calc Af Amer: >60 mL/min (ref >60)
GFR calc non Af Amer: >60 mL/min (ref >60)
Glucose, Bld: 85 mg/dL (ref 70–99)
Potassium: 3.7 mmol/L (ref 3.5–5.1)
Sodium: 139 mmol/L (ref 135–145)
Total Bilirubin: 0.2 mg/dL — ABNORMAL LOW (ref 0.3–1.2)
Total Protein: 5.9 g/dL — ABNORMAL LOW (ref 6.5–8.1)

## 2020-04-01 LAB — CBC WITH DIFFERENTIAL/PLATELET
Abs Immature Granulocytes: 0.04 10*3/uL (ref 0.00–0.07)
Basophils Absolute: 0 10*3/uL (ref 0.0–0.1)
Basophils Relative: 0 %
Eosinophils Absolute: 0.1 10*3/uL (ref 0.0–0.5)
Eosinophils Relative: 1 %
HCT: 30.1 % — ABNORMAL LOW (ref 36.0–46.0)
Hemoglobin: 9.9 g/dL — ABNORMAL LOW (ref 12.0–15.0)
Immature Granulocytes: 0 %
Lymphocytes Relative: 27 %
Lymphs Abs: 2.7 10*3/uL (ref 0.7–4.0)
MCH: 31.7 pg (ref 26.0–34.0)
MCHC: 32.9 g/dL (ref 30.0–36.0)
MCV: 96.5 fL (ref 80.0–100.0)
Monocytes Absolute: 0.5 10*3/uL (ref 0.1–1.0)
Monocytes Relative: 5 %
Neutro Abs: 6.6 10*3/uL (ref 1.7–7.7)
Neutrophils Relative %: 67 %
Platelets: 395 10*3/uL (ref 150–400)
RBC: 3.12 MIL/uL — ABNORMAL LOW (ref 3.87–5.11)
RDW: 13.7 % (ref 11.5–15.5)
WBC: 10 10*3/uL (ref 4.0–10.5)
nRBC: 0 % (ref 0.0–0.2)

## 2020-04-01 MED ORDER — TRAMADOL HCL 50 MG PO TABS: 100.0000 mg | ORAL_TABLET | Freq: Four times a day (QID) | ORAL | 0 refills | Status: DC | PRN

## 2020-04-01 MED ORDER — KETOROLAC TROMETHAMINE 10 MG PO TABS
10.0000 mg | ORAL_TABLET | Freq: Once | ORAL | Status: AC
  Administered 2020-04-01: 10 mg via ORAL
  Filled 2020-04-01: qty 1

## 2020-04-01 MED ORDER — IBUPROFEN 600 MG PO TABS: 600.0000 mg | ORAL_TABLET | Freq: Four times a day (QID) | ORAL | 0 refills | Status: DC

## 2020-04-01 MED ORDER — SERTRALINE HCL 50 MG PO TABS
50.0000 mg | ORAL_TABLET | Freq: Every day | ORAL | 0 refills | Status: DC
Start: 2020-04-01 — End: 2020-10-19

## 2020-04-01 MED ORDER — TRAMADOL HCL 50 MG PO TABS
100.0000 mg | ORAL_TABLET | Freq: Once | ORAL | Status: AC
  Administered 2020-04-01: 100 mg via ORAL
  Filled 2020-04-01: qty 2

## 2020-04-01 MED ORDER — NIFEDIPINE ER 30 MG PO TB24: 30.0000 mg | ORAL_TABLET | Freq: Every day | ORAL | 0 refills | Status: DC

## 2020-04-01 MED ORDER — NIFEDIPINE ER OSMOTIC RELEASE 30 MG PO TB24
30.0000 mg | ORAL_TABLET | Freq: Once | ORAL | Status: AC
  Administered 2020-04-01: 30 mg via ORAL
  Filled 2020-04-01: qty 1

## 2020-04-01 NOTE — MAU Provider Note (Signed)
Chief Complaint  Patient presents with   Vaginal Pain     First Provider Initiated Contact with Patient 04/01/20 1242      S: Danielle Shah  is a 34 y.o. y.o. year old G1P1021 female at 4 days S/P SVD w/ 2nd degree lac who presents to MAU with feeling pop sensation in perineum followed by throbbing pain and pressure. Incidental finding elevated blood pressures. pos Hx HTN in labor. Pre-E labs not drawn at that time. Current blood pressure medication: None.   Associated symptoms: Denies Headache, pos intermittent vision changes since 6/23. None now, Denies epigastric pain. Pos for increased pedal edema since delivery.   O:  Patient Vitals for the past 24 hrs:  BP Temp Temp src Pulse Resp SpO2  04/01/20 1315 (!) 126/95 -- -- 81 -- --  04/01/20 1300 (!) 145/96 -- -- 84 -- --  04/01/20 1234 (!) 139/98 -- -- 88 -- --  04/01/20 1215 (!) 130/97 -- -- 90 -- 99 %  04/01/20 1207 134/89 -- -- 85 -- --  04/01/20 1146 (!) 146/96 98.4 F (36.9 C) Oral 90 16 100 %   General: NAD Heart: Regular rate Lungs: Normal rate and effort Abd: Soft, NT, Gravid, S=D Extremities: 2+Pedal edema Neuro: 2+ deep tendon reflexes, No clonus Pelvic: Well-approximated second degree perineal laceration that appears to have pulled through upper-most stitch, but w/outexcessive defect. No erythema, swelling, purulent drainage. Pt tender on exam. Small amount of lochia. Nml.      Results for orders placed or performed during the hospital encounter of 04/01/20 (from the past 24 hour(s))  Comprehensive metabolic panel     Status: Abnormal   Collection Time: 04/01/20 12:46 PM  Result Value Ref Range   Sodium 139 135 - 145 mmol/L   Potassium 3.7 3.5 - 5.1 mmol/L   Chloride 108 98 - 111 mmol/L   CO2 22 22 - 32 mmol/L   Glucose, Bld 85 70 - 99 mg/dL   BUN 7 6 - 20 mg/dL   Creatinine, Ser 0.55 0.44 - 1.00 mg/dL   Calcium 8.7 (L) 8.9 - 10.3 mg/dL   Total Protein 5.9 (L) 6.5 - 8.1 g/dL   Albumin 2.5 (L) 3.5 - 5.0 g/dL    AST 50 (H) 15 - 41 U/L   ALT 46 (H) 0 - 44 U/L   Alkaline Phosphatase 130 (H) 38 - 126 U/L   Total Bilirubin 0.2 (L) 0.3 - 1.2 mg/dL   GFR calc non Af Amer >60 >60 mL/min   GFR calc Af Amer >60 >60 mL/min   Anion gap 9 5 - 15  CBC with Differential/Platelet     Status: Abnormal   Collection Time: 04/01/20 12:46 PM  Result Value Ref Range   WBC 10.0 4.0 - 10.5 K/uL   RBC 3.12 (L) 3.87 - 5.11 MIL/uL   Hemoglobin 9.9 (L) 12.0 - 15.0 g/dL   HCT 30.1 (L) 36 - 46 %   MCV 96.5 80.0 - 100.0 fL   MCH 31.7 26.0 - 34.0 pg   MCHC 32.9 30.0 - 36.0 g/dL   RDW 13.7 11.5 - 15.5 %   Platelets 395 150 - 400 K/uL   nRBC 0.0 0.0 - 0.2 %   Neutrophils Relative % 67 %   Neutro Abs 6.6 1.7 - 7.7 K/uL   Lymphocytes Relative 27 %   Lymphs Abs 2.7 0.7 - 4.0 K/uL   Monocytes Relative 5 %   Monocytes Absolute 0.5 0 - 1 K/uL  Eosinophils Relative 1 %   Eosinophils Absolute 0.1 0 - 0 K/uL   Basophils Relative 0 %   Basophils Absolute 0.0 0 - 0 K/uL   Immature Granulocytes 0 %   Abs Immature Granulocytes 0.04 0.00 - 0.07 K/uL    MAU Course Orders Placed This Encounter  Procedures   Comprehensive metabolic panel    Standing Status:   Standing    Number of Occurrences:   1    Order Specific Question:   Specimen collection method    Answer:   Unit=Unit collect   Protein / creatinine ratio, urine    Standing Status:   Standing    Number of Occurrences:   1   Urinalysis, Routine w reflex microscopic    Standing Status:   Standing    Number of Occurrences:   1   CBC with Differential/Platelet    Standing Status:   Standing    Number of Occurrences:   1   Apply ice    Standing Status:   Standing    Number of Occurrences:   1   Discharge patient    Order Specific Question:   Discharge disposition    Answer:   01-Home or Self Care [1]    Order Specific Question:   Discharge patient date    Answer:   04/01/2020   Meds ordered this encounter  Medications   ketorolac (TORADOL) tablet 10  mg   traMADol (ULTRAM) tablet 100 mg   NIFEdipine (PROCARDIA-XL/NIFEDICAL-XL) 24 hr tablet 30 mg   ibuprofen (ADVIL) 600 MG tablet    Sig: Take 1 tablet (600 mg total) by mouth every 6 (six) hours.    Dispense:  30 tablet    Refill:  0    Order Specific Question:   Supervising Provider    Answer:   LEGGETT, KELLY H [[id number]]   sertraline (ZOLOFT) 50 MG tablet    Sig: Take 1 tablet (50 mg total) by mouth daily.    Dispense:  30 tablet    Refill:  0    Order Specific Question:   Supervising Provider    Answer:   LEGGETT, KELLY H [[id number]]   traMADol (ULTRAM) 50 MG tablet    Sig: Take 2 tablets (100 mg total) by mouth every 6 (six) hours as needed for severe pain.    Dispense:  30 tablet    Refill:  0    Order Specific Question:   Supervising Provider    Answer:   LEGGETT, KELLY H [[id number]]   NIFEdipine (ADALAT CC) 30 MG 24 hr tablet    Sig: Take 1 tablet (30 mg total) by mouth daily.    Dispense:  30 tablet    Refill:  0    Order Specific Question:   Supervising Provider    Answer:   LEGGETT, KELLY H [[id number]]     MDM - Mildly elevated BP and mildly elevated lister enzymes that do not meet criteria for Pre-E. Started Procardia. Pt instructed to F/U w/ [facility] in 3 days for BP and liver enzymes check.    - Perineal pain w/ suture pulled through at posterior fourchette. Laceration well-approximated. No evidence of infection. Pt a little more uncomfortable w/ exam than expected, but also hasn't had any pain meds postpartum. Will resend Rx IBU 600 mg Q6, Ultram PRN for severe pain.   A: [redacted]w[redacted]d week IUP Gestational hypertension Elevated liver enzymes  P: Discharge home in stable condition per consult with Dr Rosana Hoes. Preeclampsia precautions. Discussed  labs and F/U w/ Dr.Kulwu. Agrees w/ POC.  Follow-up for blood pressure check and repeat liver enzymes in 3 days at your doctor's office sooner as needed if symptoms worsen. Return to maternity admissions as needed in [place],  [place], [place] 04/01/2020 3:45 PM

## 2020-04-01 NOTE — MAU Note (Signed)
Vag del on 6/21.The other day, was on the toilet, felt a little pop.  Since then has gotten worse, feels a throbbing or pulsing in vagina is burning when she pees today. Hurts to sit and stand.

## 2020-04-01 NOTE — Discharge Instructions (Signed)
Postpartum Hypertension Postpartum hypertension is high blood pressure that remains higher than normal after childbirth. You may not realize that you have postpartum hypertension if your blood pressure is not being checked regularly. In most cases, postpartum hypertension will go away on its own, usually within a week of delivery. However, for some women, medical treatment is required to prevent serious complications, such as seizures or stroke. What are the causes? This condition may be caused by one or more of the following:  Hypertension that existed before pregnancy (chronic hypertension).  Hypertension that comes on as a result of pregnancy (gestational hypertension).  Hypertensive disorders during pregnancy (preeclampsia) or seizures in women who have high blood pressure during pregnancy (eclampsia).  A condition in which the liver, platelets, and red blood cells are damaged during pregnancy (HELLP syndrome).  A condition in which the thyroid produces too much hormones (hyperthyroidism).  Other rare problems of the nerves (neurological disorders) or blood disorders. In some cases, the cause may not be known. What increases the risk? The following factors may make you more likely to develop this condition:  Chronic hypertension. In some cases, this may not have been diagnosed before pregnancy.  Obesity.  Type 2 diabetes.  Kidney disease.  History of preeclampsia or eclampsia.  Other medical conditions that change the level of hormones in the body (hormonal imbalance). What are the signs or symptoms? As with all types of hypertension, postpartum hypertension may not have any symptoms. Depending on how high your blood pressure is, you may experience:  Headaches. These may be mild, moderate, or severe. They may also be steady, constant, or sudden in onset (thunderclap headache).  Changes in your ability to see (visual changes).  Dizziness.  Shortness of breath.  Swelling  of your hands, feet, lower legs, or face. In some cases, you may have swelling in more than one of these locations.  Heart palpitations or a racing heartbeat.  Difficulty breathing while lying down.  Decrease in the amount of urine that you pass. Other rare signs and symptoms may include:  Sweating more than usual. This lasts longer than a few days after delivery.  Chest pain.  Sudden dizziness when you get up from sitting or lying down.  Seizures.  Nausea or vomiting.  Abdominal pain. How is this diagnosed? This condition may be diagnosed based on the results of a physical exam, blood pressure measurements, and blood and urine tests. You may also have other tests, such as a CT scan or an MRI, to check for other problems of postpartum hypertension. How is this treated? If blood pressure is high enough to require treatment, your options may include:  Medicines to reduce blood pressure (antihypertensives). Tell your health care provider if you are breastfeeding or if you plan to breastfeed. There are many antihypertensive medicines that are safe to take while breastfeeding.  Stopping medicines that may be causing hypertension.  Treating medical conditions that are causing hypertension.  Treating the complications of hypertension, such as seizures, stroke, or kidney problems. Your health care provider will also continue to monitor your blood pressure closely until it is within a safe range for you. Follow these instructions at home:  Take over-the-counter and prescription medicines only as told by your health care provider.  Return to your normal activities as told by your health care provider. Ask your health care provider what activities are safe for you.  Do not use any products that contain nicotine or tobacco, such as cigarettes and e-cigarettes. If   you need help quitting, ask your health care provider.  Keep all follow-up visits as told by your health care provider. This  is important. Contact a health care provider if:  Your symptoms get worse.  You have new symptoms, such as: ? A headache that does not get better. ? Dizziness. ? Visual changes. Get help right away if:  You suddenly develop swelling in your hands, ankles, or face.  You have sudden, rapid weight gain.  You develop difficulty breathing, chest pain, racing heartbeat, or heart palpitations.  You develop severe pain in your abdomen.  You have any symptoms of a stroke. "BE FAST" is an easy way to remember the main warning signs of a stroke: ? B - Balance. Signs are dizziness, sudden trouble walking, or loss of balance. ? E - Eyes. Signs are trouble seeing or a sudden change in vision. ? F - Face. Signs are sudden weakness or numbness of the face, or the face or eyelid drooping on one side. ? A - Arms. Signs are weakness or numbness in an arm. This happens suddenly and usually on one side of the body. ? S - Speech. Signs are sudden trouble speaking, slurred speech, or trouble understanding what people say. ? T - Time. Time to call emergency services. Write down what time symptoms started.  You have other signs of a stroke, such as: ? A sudden, severe headache with no known cause. ? Nausea or vomiting. ? Seizure. These symptoms may represent a serious problem that is an emergency. Do not wait to see if the symptoms will go away. Get medical help right away. Call your local emergency services (911 in the [place] Do not drive yourself to the hospital. Summary  Postpartum hypertension is high blood pressure that remains higher than normal after childbirth.  In most cases, postpartum hypertension will go away on its own, usually within a week of delivery.  For some women, medical treatment is required to prevent serious complications, such as seizures or stroke. This information is not intended to replace advice given to you by your health care provider. Make sure you discuss any questions  you have with your health care provider. Document Revised: 10/31/2018 Document Reviewed: 07/15/2017 Elsevier Patient Education  2020 [place] of a Perineal Tear A perineal tear is a cut or tear (laceration) in the tissue between the opening of the vagina and the anus (perineum). Some women develop a perineal tear during a vaginal birth. This can happen as the baby emerges from the birth canal and the perineum is stretched. There are four degrees of perineal tears based on how deep and long the laceration is:  First degree. This involves a shallow tear at the edge of the vaginal opening that extends slightly into the perineal skin.  Second degree. This involves tearing described in first degree perineal tear, and an additional deeper tear of the vaginal opening and perineal tissues. It may also include tearing of a muscle just under the perineal skin.  Third degree. This involves tearing described in first and second degree perineal tears, with the addition that tearing in the third degree extends into the muscle of the anus (anal sphincter).  Fourth degree. This involves all levels of tears described in first, second, and third degree perineal tears, with the tear in the fourth degree extending into the rectum. First and second degree perineal tears may or may not be stitched closed, depending on their location and appearance. Third  and fourth degree perineal tears are stitched closed immediately after the baby's birth. What are the risks? Depending on the type of perineal tear you have, you may be at risk for:  Bleeding.  Developing a collection of blood in the perineal tear area (hematoma).  Pain. This may include pain when you urinate, or pain when you have a bowel movement.  Infection at the site of the tear.  Fever.  Trouble controlling your urination or bowels (incontinence).  Painful sex. How to care for a perineal tear Wound care  Take a sitz bath as told by  your health care provider. A sitz bath is a warm water bath that is taken while you are sitting down. The water should only come up to your hips and should cover your buttocks. This can speed up healing. 1. Partially fill a bathtub with warm water. You will only need the water to be deep enough to cover your hips and buttocks when you are sitting in it. 2. If your health care provider told you to put medicine in the water, follow the directions exactly as told. 3. Sit in the water and open the tub drain a little. 4. Turn on the warm water again to keep the tub at the correct level. Keep the water running constantly. 5. Soak in the water for 15-20 minutes or as told by your health care provider. 6. After the sitz bath, pat the affected area dry first. Do not rub it. 7. Be careful when you stand up after the sitz bath because you may feel dizzy.  Wash your hands before and after applying medicine to the area.  Wear a sanitary pad as told by your health care provider. Change the pad as often as told by your health care provider.  Leave stitches (sutures), skin glue, or adhesive strips in place. These skin closures may need to stay in place for 2 weeks or longer. If adhesive strip edges start to loosen and curl up, you may trim the loose edges. Do not remove adhesive strips completely unless your health care provider tells you to do that.  Check your wound every day for signs of infection. Check for: ? Redness, swelling, or pain. ? Fluid or blood. ? Warmth. ? Pus or a bad smell. Managing pain  If directed, put ice on the painful area: ? Put ice in a plastic bag. ? Place a towel between your skin and the bag. ? Leave the ice on for 20 minutes, 2-3 times a day.  Apply a numbing spray to the perineal tear site as told by your health care provider. This may help with discomfort.  Take and apply over-the-counter and prescription medicines only as told by your health care provider.  If told, put  about 3 witch hazel-containing hemorrhoid treatment pads on top of your sanitary pad. The witch hazel in the hemorrhoid pads helps with swelling and discomfort.  Sit on an inflatable ring or pillow. This may provide comfort. General instructions  Squeeze warm water on your perineum after urinating. This should be done from front to back with a squeeze bottle. Pat the area to dry it.  Do not have sex, use tampons, or place anything in your vagina for at least 6 weeks or as told by your health care provider.  Keep all follow-up visits as told by your health care provider. These include any postpartum visits. This is important. Contact a health care provider if:  Your pain is not relieved with  medicines.  You have painful urination.  You have redness, swelling, or pain around your tear.  You have fluid or blood coming from your tear.  Your tear feels warm to the touch.  You have pus or a bad smell coming from your tear.  You have a fever. Get help right away if:  Your tear opens.  You cannot urinate.  You have an increase in bleeding.  You have severe pain. Summary  A perineal tear is a cut or tear (laceration) in the tissue between the opening of the vagina and the anus (perineum).  There are four degrees of perineal tears based on how deep and long the laceration is.  First and second-degree perineal tears may or may not be stitched closed, depending on their location and appearance. Third and fourth- degree perineal tears are stitched closed immediately after the baby's birth.  Follow your health care provider's instructions for caring for your perineal tear. Know how to manage pain and how to care for your wound. Know when to call your health care provider and when to seek immediate emergency care. This information is not intended to replace advice given to you by your health care provider. Make sure you discuss any questions you have with your health care  provider. Document Revised: 09/06/2017 Document Reviewed: 10/29/2016 Elsevier Patient Education  2020 [employer].

## 2020-04-05 DIAGNOSIS — R748 Abnormal levels of other serum enzymes: Secondary | ICD-10-CM | POA: Diagnosis not present

## 2020-04-20 DIAGNOSIS — R309 Painful micturition, unspecified: Secondary | ICD-10-CM | POA: Diagnosis not present

## 2020-05-20 DIAGNOSIS — U071 COVID-19: Secondary | ICD-10-CM | POA: Diagnosis not present

## 2020-06-08 DIAGNOSIS — F4323 Adjustment disorder with mixed anxiety and depressed mood: Secondary | ICD-10-CM | POA: Diagnosis not present

## 2020-06-20 DIAGNOSIS — F4323 Adjustment disorder with mixed anxiety and depressed mood: Secondary | ICD-10-CM | POA: Diagnosis not present

## 2020-06-28 DIAGNOSIS — F4323 Adjustment disorder with mixed anxiety and depressed mood: Secondary | ICD-10-CM | POA: Diagnosis not present

## 2020-07-07 DIAGNOSIS — F4323 Adjustment disorder with mixed anxiety and depressed mood: Secondary | ICD-10-CM | POA: Diagnosis not present

## 2020-07-14 DIAGNOSIS — F4323 Adjustment disorder with mixed anxiety and depressed mood: Secondary | ICD-10-CM | POA: Diagnosis not present

## 2020-07-26 DIAGNOSIS — Z03818 Encounter for observation for suspected exposure to other biological agents ruled out: Secondary | ICD-10-CM | POA: Diagnosis not present

## 2020-07-28 DIAGNOSIS — F4323 Adjustment disorder with mixed anxiety and depressed mood: Secondary | ICD-10-CM | POA: Diagnosis not present

## 2020-07-29 DIAGNOSIS — Z20822 Contact with and (suspected) exposure to covid-19: Secondary | ICD-10-CM | POA: Diagnosis not present

## 2020-08-08 DIAGNOSIS — F4323 Adjustment disorder with mixed anxiety and depressed mood: Secondary | ICD-10-CM | POA: Diagnosis not present

## 2020-08-16 DIAGNOSIS — F4323 Adjustment disorder with mixed anxiety and depressed mood: Secondary | ICD-10-CM | POA: Diagnosis not present

## 2020-08-30 DIAGNOSIS — Z20822 Contact with and (suspected) exposure to covid-19: Secondary | ICD-10-CM | POA: Diagnosis not present

## 2020-10-08 NOTE — L&D Delivery Note (Signed)
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 Plan to breastfeeding and declined needing medication during breastfeeding. H/O GDMA2 and declined GTT during this pregnancy, admitted CBG was unknown. I assumed cared at 0700. I was called to the room when she progressed 2+ station in the second stage of labor.  She pushed for 7/min.  She delivered a viable infant, cephalic and restituted to the LOA position over an intact perineum.  A nuchal cord   was identified, tight at first, bu was able to reduce after shoulder delivered. The baby was placed on maternal abdomen while initial step of NRP were perfmored (Dry, Stimulated, and warmed). Hat placed on baby for thermoregulation. Delayed cord clamping was performed for 2 minutes.  Cord double clamped and cut.  Cord cut by father. Apgar scores were 8 and 9. Prophylactic Pitocin was started in the third stage of labor for active management. The placenta delivered spontaneously, shultz, with a 3 vessel cord and was sent to LD.  Inspection revealed none. An examination of the vaginal vault and cervix was free from lacerations. The uterus was firm, bleeding stable.   Placenta and umbilical artery blood gas were not sent.  There were no complications during the procedure.  Mom and baby skin to skin following delivery. Left in stable condition.  Maternal Info: Anesthesia:  Epidural Episiotomy: no Lacerations:  no Suture Repair: no Est. Blood Loss (mL):  1212m  Newborn Info:  Baby Sex: female Circumcision: N/A  APGAR (1 MIN): 8   APGAR (5 MINS): 9   APGAR (10 MINS):     Mom to postpartum.  Baby to Couplet care / Skin to Skin.  DR Dillard aware.   Ja[place]CNN[place]NP-C 06/02/21 9:03 AM

## 2020-10-19 ENCOUNTER — Telehealth (INDEPENDENT_AMBULATORY_CARE_PROVIDER_SITE_OTHER): Payer: No Typology Code available for payment source | Admitting: Family Medicine

## 2020-10-19 ENCOUNTER — Other Ambulatory Visit: Payer: Self-pay

## 2020-10-19 ENCOUNTER — Encounter: Payer: Self-pay | Admitting: Family Medicine

## 2020-10-19 VITALS — Ht 62.0 in | Wt 163.0 lb

## 2020-10-19 DIAGNOSIS — E663 Overweight: Secondary | ICD-10-CM

## 2020-10-19 DIAGNOSIS — F900 Attention-deficit hyperactivity disorder, predominantly inattentive type: Secondary | ICD-10-CM | POA: Diagnosis not present

## 2020-10-19 DIAGNOSIS — E559 Vitamin D deficiency, unspecified: Secondary | ICD-10-CM

## 2020-10-19 DIAGNOSIS — F322 Major depressive disorder, single episode, severe without psychotic features: Secondary | ICD-10-CM | POA: Diagnosis not present

## 2020-10-19 DIAGNOSIS — E785 Hyperlipidemia, unspecified: Secondary | ICD-10-CM

## 2020-10-19 MED ORDER — AMPHETAMINE-DEXTROAMPHET ER 20 MG PO CP24: 20.0000 mg | ORAL_CAPSULE | ORAL | 0 refills | Status: DC

## 2020-10-19 MED ORDER — AMPHETAMINE-DEXTROAMPHET ER 20 MG PO CP24: 20.0000 mg | ORAL_CAPSULE | Freq: Every day | ORAL | 0 refills | Status: DC

## 2020-10-19 NOTE — Assessment & Plan Note (Signed)
Untreated currently and uncontrolled Resume adderall as before and re eval in 3 months

## 2020-10-19 NOTE — Progress Notes (Signed)
Virtual Visit via Telephone Note  I connected with Danielle Shah on 10/19/20 at 10:00 AM EST by telephone and verified that I am speaking with the correct person using two identifiers.  Location: Patient: work  Secondary school teacher: work   I discussed the limitations, risks, security and privacy concerns of performing an evaluation and management service by telephone and the availability of in person appointments. I also discussed with the patient that there may be a patient responsible charge related to this service. The patient expressed understanding and agreed to proceed.   History of Present Illness: F/u ADHD, I need to get back on medication, my focus and concentration are off , I stopped as soon as I knew I was pregnant My depression is improved and I am now on zoloft which was prescribed by OB. I also do therapy every 2 weeks Denies recent fever or chills. No other complaints or concerns       Observations/Objective: Ht 5\' 2"  (1.575 m)   Wt 163 lb (73.9 kg)   BMI 29.81 kg/m  Good communication with no confusion and intact memory. Alert and oriented x 3 No signs of respiratory distress during speech   Assessment and Plan: Overweight (BMI 25.0-29.9)  Patient re-educated about  the importance of commitment to a  minimum of 150 minutes of exercise per week as able.  The importance of healthy food choices with portion control discussed, as well as eating regularly and within a 12 hour window most days. The need to choose "clean , green" food 50 to 75% of the time is discussed, as well as to make water the primary drink and set a goal of 64 ounces water daily.    Weight /BMI 10/19/2020 03/28/2020 02/18/2020  WEIGHT 163 lb 183 lb 180 lb 11.2 oz  HEIGHT 5\' 2"  5\' 3"  5\' 3"   BMI 29.81 kg/m2 32.42 kg/m2 32.01 kg/m2      Depression, major, single episode, severe (HCC) Controlled, no change in medication   ADHD (attention deficit hyperactivity disorder) Untreated currently and  uncontrolled Resume adderall as before and re eval in 3 months  Hyperlipidemia, mild Hyperlipidemia:Low fat diet discussed and encouraged.   Lipid Panel  Lab Results  Component Value Date   CHOL 201 (H) 10/14/2013   HDL 39 (L) 10/14/2013   LDLCALC 136 (H) 10/14/2013   TRIG 130 10/14/2013   CHOLHDL 5.2 10/14/2013   Updated lab needed at/ before next visit.       Follow Up Instructions:    I discussed the assessment and treatment plan with the patient. The patient was provided an opportunity to ask questions and all were answered. The patient agreed with the plan and demonstrated an understanding of the instructions.   The patient was advised to call back or seek an in-person evaluation if the symptoms worsen or if the condition fails to improve as anticipated.  I provided 24 minutes of non-face-to-face time during this encounter.   Tula Nakayama, MD

## 2020-10-19 NOTE — Assessment & Plan Note (Signed)
Controlled, no change in medication  

## 2020-10-19 NOTE — Assessment & Plan Note (Signed)
  Patient re-educated about  the importance of commitment to a  minimum of 150 minutes of exercise per week as able.  The importance of healthy food choices with portion control discussed, as well as eating regularly and within a 12 hour window most days. The need to choose "clean , green" food 50 to 75% of the time is discussed, as well as to make water the primary drink and set a goal of 64 ounces water daily.    Weight /BMI 10/19/2020 03/28/2020 02/18/2020  WEIGHT 163 lb 183 lb 180 lb 11.2 oz  HEIGHT 5\' 2"  5\' 3"  5\' 3"   BMI 29.81 kg/m2 32.42 kg/m2 32.01 kg/m2

## 2020-10-19 NOTE — Assessment & Plan Note (Signed)
Hyperlipidemia:Low fat diet discussed and encouraged.   Lipid Panel  Lab Results  Component Value Date   CHOL 201 (H) 10/14/2013   HDL 39 (L) 10/14/2013   LDLCALC 136 (H) 10/14/2013   TRIG 130 10/14/2013   CHOLHDL 5.2 10/14/2013   Updated lab needed at/ before next visit.

## 2020-10-19 NOTE — Patient Instructions (Signed)
F/U in office with MD in 10 weeks, call if you need me sooner. Re evaluate ADHD and sign contract  Please get flu vaccine, this is overdue  Please get TdAP if you did not get this when pregnant, you will need to speak with your OB/ Gyne  Please get fasting CBC, lipid, cmp and EGFr, TSH and vitD 1 week before visit   It is important that you exercise regularly at least 30 minutes 5 times a week. If you develop chest pain, have severe difficulty breathing, or feel very tired, stop exercising immediately and seek medical attention  Think about what you will eat, plan ahead. Choose " clean, green, fresh or frozen" over canned, processed or packaged foods which are more sugary, salty and fatty. 70 to 75% of food eaten should be vegetables and fruit. Three meals at set times with snacks allowed between meals, but they must be fruit or vegetables. Aim to eat over a 12 hour period , example 7 am to 7 pm, and STOP after  your last meal of the day. Drink water,generally about 64 ounces per day, no other drink is as healthy. Fruit juice is best enjoyed in a healthy way, by EATING the fruit. Thanks for choosing [facility], we consider it a privelige to serve you.

## 2020-10-20 ENCOUNTER — Other Ambulatory Visit: Payer: Self-pay

## 2020-10-20 DIAGNOSIS — Z1159 Encounter for screening for other viral diseases: Secondary | ICD-10-CM

## 2020-10-20 DIAGNOSIS — E559 Vitamin D deficiency, unspecified: Secondary | ICD-10-CM

## 2020-10-20 DIAGNOSIS — E785 Hyperlipidemia, unspecified: Secondary | ICD-10-CM

## 2020-11-15 DIAGNOSIS — N912 Amenorrhea, unspecified: Secondary | ICD-10-CM | POA: Diagnosis not present

## 2020-11-15 DIAGNOSIS — F329 Major depressive disorder, single episode, unspecified: Secondary | ICD-10-CM | POA: Diagnosis not present

## 2020-11-15 DIAGNOSIS — Z113 Encounter for screening for infections with a predominantly sexual mode of transmission: Secondary | ICD-10-CM | POA: Diagnosis not present

## 2020-11-15 DIAGNOSIS — Z331 Pregnant state, incidental: Secondary | ICD-10-CM | POA: Diagnosis not present

## 2020-11-15 DIAGNOSIS — O139 Gestational [pregnancy-induced] hypertension without significant proteinuria, unspecified trimester: Secondary | ICD-10-CM | POA: Diagnosis not present

## 2020-11-15 DIAGNOSIS — E559 Vitamin D deficiency, unspecified: Secondary | ICD-10-CM | POA: Diagnosis not present

## 2020-11-15 DIAGNOSIS — Z3A09 9 weeks gestation of pregnancy: Secondary | ICD-10-CM | POA: Diagnosis not present

## 2020-11-15 DIAGNOSIS — Z369 Encounter for antenatal screening, unspecified: Secondary | ICD-10-CM | POA: Diagnosis not present

## 2020-11-15 DIAGNOSIS — O3680X9 Pregnancy with inconclusive fetal viability, other fetus: Secondary | ICD-10-CM | POA: Diagnosis not present

## 2020-11-18 DIAGNOSIS — O139 Gestational [pregnancy-induced] hypertension without significant proteinuria, unspecified trimester: Secondary | ICD-10-CM | POA: Diagnosis not present

## 2020-11-18 DIAGNOSIS — N925 Other specified irregular menstruation: Secondary | ICD-10-CM | POA: Diagnosis not present

## 2020-11-18 LAB — OB RESULTS CONSOLE RPR: RPR: NONREACTIVE

## 2020-11-18 LAB — OB RESULTS CONSOLE GC/CHLAMYDIA
Chlamydia: NEGATIVE
Gonorrhea: NEGATIVE

## 2020-11-18 LAB — OB RESULTS CONSOLE ANTIBODY SCREEN: Antibody Screen: NEGATIVE

## 2020-11-18 LAB — OB RESULTS CONSOLE RUBELLA ANTIBODY, IGM: Rubella: IMMUNE

## 2020-11-18 LAB — OB RESULTS CONSOLE HIV ANTIBODY (ROUTINE TESTING): HIV: NONREACTIVE

## 2020-11-18 LAB — OB RESULTS CONSOLE HEPATITIS B SURFACE ANTIGEN: Hepatitis B Surface Ag: NEGATIVE

## 2020-11-18 LAB — OB RESULTS CONSOLE ABO/RH: RH Type: POSITIVE

## 2020-11-23 DIAGNOSIS — Z3A10 10 weeks gestation of pregnancy: Secondary | ICD-10-CM | POA: Diagnosis not present

## 2020-11-23 DIAGNOSIS — O3680X9 Pregnancy with inconclusive fetal viability, other fetus: Secondary | ICD-10-CM | POA: Diagnosis not present

## 2020-11-25 ENCOUNTER — Encounter: Payer: Self-pay | Admitting: Obstetrics and Gynecology

## 2020-12-28 ENCOUNTER — Ambulatory Visit: Payer: No Typology Code available for payment source | Admitting: Family Medicine

## 2021-01-02 ENCOUNTER — Telehealth: Payer: Medicaid Other | Admitting: Family Medicine

## 2021-01-03 ENCOUNTER — Telehealth: Payer: Medicaid Other | Admitting: Family Medicine

## 2021-01-09 ENCOUNTER — Other Ambulatory Visit: Payer: Self-pay

## 2021-01-09 ENCOUNTER — Telehealth: Payer: Medicaid Other | Admitting: Family Medicine

## 2021-02-09 ENCOUNTER — Other Ambulatory Visit: Payer: Self-pay | Admitting: Obstetrics and Gynecology

## 2021-02-09 DIAGNOSIS — Z3A24 24 weeks gestation of pregnancy: Secondary | ICD-10-CM

## 2021-02-09 DIAGNOSIS — Z363 Encounter for antenatal screening for malformations: Secondary | ICD-10-CM

## 2021-02-28 ENCOUNTER — Other Ambulatory Visit: Payer: Self-pay

## 2021-02-28 ENCOUNTER — Ambulatory Visit: Payer: Medicaid Other | Attending: Obstetrics and Gynecology

## 2021-02-28 ENCOUNTER — Other Ambulatory Visit: Payer: Self-pay | Admitting: *Deleted

## 2021-02-28 ENCOUNTER — Other Ambulatory Visit: Payer: Self-pay | Admitting: Obstetrics and Gynecology

## 2021-02-28 DIAGNOSIS — O09522 Supervision of elderly multigravida, second trimester: Secondary | ICD-10-CM | POA: Diagnosis not present

## 2021-02-28 DIAGNOSIS — Z3A24 24 weeks gestation of pregnancy: Secondary | ICD-10-CM

## 2021-02-28 DIAGNOSIS — Z363 Encounter for antenatal screening for malformations: Secondary | ICD-10-CM

## 2021-02-28 DIAGNOSIS — O09529 Supervision of elderly multigravida, unspecified trimester: Secondary | ICD-10-CM

## 2021-03-21 DIAGNOSIS — Z3493 Encounter for supervision of normal pregnancy, unspecified, third trimester: Secondary | ICD-10-CM | POA: Diagnosis not present

## 2021-03-23 ENCOUNTER — Encounter: Payer: Self-pay | Admitting: *Deleted

## 2021-03-29 ENCOUNTER — Other Ambulatory Visit: Payer: Self-pay

## 2021-03-29 ENCOUNTER — Ambulatory Visit: Payer: Medicaid Other | Attending: Obstetrics

## 2021-03-29 ENCOUNTER — Ambulatory Visit: Payer: Medicaid Other | Admitting: *Deleted

## 2021-03-29 ENCOUNTER — Encounter: Payer: Self-pay | Admitting: *Deleted

## 2021-03-29 ENCOUNTER — Other Ambulatory Visit: Payer: Self-pay | Admitting: *Deleted

## 2021-03-29 VITALS — BP 116/74 | HR 107

## 2021-03-29 DIAGNOSIS — Z8759 Personal history of other complications of pregnancy, childbirth and the puerperium: Secondary | ICD-10-CM

## 2021-03-29 DIAGNOSIS — Z3A28 28 weeks gestation of pregnancy: Secondary | ICD-10-CM

## 2021-03-29 DIAGNOSIS — O09529 Supervision of elderly multigravida, unspecified trimester: Secondary | ICD-10-CM | POA: Diagnosis present

## 2021-03-29 DIAGNOSIS — Z8632 Personal history of gestational diabetes: Secondary | ICD-10-CM | POA: Diagnosis not present

## 2021-03-29 DIAGNOSIS — O99343 Other mental disorders complicating pregnancy, third trimester: Secondary | ICD-10-CM

## 2021-03-29 DIAGNOSIS — O09523 Supervision of elderly multigravida, third trimester: Secondary | ICD-10-CM | POA: Diagnosis not present

## 2021-05-10 ENCOUNTER — Ambulatory Visit: Payer: Medicaid Other | Attending: Maternal & Fetal Medicine

## 2021-05-10 ENCOUNTER — Other Ambulatory Visit: Payer: Self-pay

## 2021-05-10 ENCOUNTER — Ambulatory Visit: Payer: Medicaid Other | Admitting: *Deleted

## 2021-05-10 ENCOUNTER — Encounter: Payer: Self-pay | Admitting: *Deleted

## 2021-05-10 VITALS — BP 118/77 | HR 102

## 2021-05-10 DIAGNOSIS — O24410 Gestational diabetes mellitus in pregnancy, diet controlled: Secondary | ICD-10-CM

## 2021-05-10 DIAGNOSIS — O09523 Supervision of elderly multigravida, third trimester: Secondary | ICD-10-CM

## 2021-05-10 DIAGNOSIS — Z8759 Personal history of other complications of pregnancy, childbirth and the puerperium: Secondary | ICD-10-CM

## 2021-05-10 DIAGNOSIS — Z3A34 34 weeks gestation of pregnancy: Secondary | ICD-10-CM

## 2021-05-10 DIAGNOSIS — O99343 Other mental disorders complicating pregnancy, third trimester: Secondary | ICD-10-CM | POA: Diagnosis not present

## 2021-05-10 NOTE — Progress Notes (Signed)
C/o gush of fluid 05/09/21, denies any now; advised to inform OB MD.

## 2021-05-11 ENCOUNTER — Other Ambulatory Visit: Payer: Self-pay | Admitting: *Deleted

## 2021-05-11 DIAGNOSIS — O24410 Gestational diabetes mellitus in pregnancy, diet controlled: Secondary | ICD-10-CM

## 2021-05-22 ENCOUNTER — Encounter (HOSPITAL_COMMUNITY): Payer: Self-pay | Admitting: Obstetrics and Gynecology

## 2021-05-22 ENCOUNTER — Inpatient Hospital Stay (HOSPITAL_COMMUNITY)
Admission: AD | Admit: 2021-05-22 | Discharge: 2021-05-22 | Disposition: A | Discharge status: Home / Self Care | Payer: No Typology Code available for payment source | Attending: Obstetrics and Gynecology | Admitting: Obstetrics and Gynecology

## 2021-05-22 DIAGNOSIS — Z3689 Encounter for other specified antenatal screening: Secondary | ICD-10-CM

## 2021-05-22 DIAGNOSIS — M549 Dorsalgia, unspecified: Secondary | ICD-10-CM | POA: Insufficient documentation

## 2021-05-22 DIAGNOSIS — F419 Anxiety disorder, unspecified: Secondary | ICD-10-CM | POA: Insufficient documentation

## 2021-05-22 DIAGNOSIS — Z3A36 36 weeks gestation of pregnancy: Secondary | ICD-10-CM | POA: Diagnosis not present

## 2021-05-22 DIAGNOSIS — O09523 Supervision of elderly multigravida, third trimester: Secondary | ICD-10-CM | POA: Insufficient documentation

## 2021-05-22 DIAGNOSIS — Z0371 Encounter for suspected problem with amniotic cavity and membrane ruled out: Secondary | ICD-10-CM

## 2021-05-22 DIAGNOSIS — O99343 Other mental disorders complicating pregnancy, third trimester: Secondary | ICD-10-CM | POA: Insufficient documentation

## 2021-05-22 DIAGNOSIS — Z880 Allergy status to penicillin: Secondary | ICD-10-CM | POA: Diagnosis not present

## 2021-05-22 DIAGNOSIS — O99213 Obesity complicating pregnancy, third trimester: Secondary | ICD-10-CM | POA: Insufficient documentation

## 2021-05-22 DIAGNOSIS — R109 Unspecified abdominal pain: Secondary | ICD-10-CM

## 2021-05-22 DIAGNOSIS — O26893 Other specified pregnancy related conditions, third trimester: Secondary | ICD-10-CM | POA: Diagnosis not present

## 2021-05-22 DIAGNOSIS — O24113 Pre-existing diabetes mellitus, type 2, in pregnancy, third trimester: Secondary | ICD-10-CM | POA: Diagnosis not present

## 2021-05-22 DIAGNOSIS — Z79899 Other long term (current) drug therapy: Secondary | ICD-10-CM | POA: Diagnosis not present

## 2021-05-22 DIAGNOSIS — F32A Depression, unspecified: Secondary | ICD-10-CM | POA: Diagnosis not present

## 2021-05-22 LAB — WET PREP, GENITAL
Clue Cells Wet Prep HPF POC: NONE SEEN
Sperm: NONE SEEN
Trich, Wet Prep: NONE SEEN
Yeast Wet Prep HPF POC: NONE SEEN

## 2021-05-22 LAB — URINALYSIS, ROUTINE W REFLEX MICROSCOPIC
Bilirubin Urine: NEGATIVE
Glucose, UA: NEGATIVE mg/dL
Hgb urine dipstick: NEGATIVE
Ketones, ur: NEGATIVE mg/dL
Nitrite: NEGATIVE
Protein, ur: NEGATIVE mg/dL
Specific Gravity, Urine: 1.02 (ref 1.005–1.030)
pH: 6 (ref 5.0–8.0)

## 2021-05-22 LAB — POCT FERN TEST: POCT Fern Test: NEGATIVE

## 2021-05-22 MED ORDER — CYCLOBENZAPRINE HCL 5 MG PO TABS
10.0000 mg | ORAL_TABLET | Freq: Once | ORAL | Status: AC
  Administered 2021-05-22: 10 mg via ORAL
  Filled 2021-05-22: qty 2

## 2021-05-22 MED ORDER — CYCLOBENZAPRINE HCL 10 MG PO TABS: 10.0000 mg | ORAL_TABLET | Freq: Two times a day (BID) | ORAL | 0 refills | Status: DC | PRN

## 2021-05-22 NOTE — MAU Note (Signed)
.  Danielle Shah is a 35 y.o. at 18w3dhere in MAU reporting: possible SROM at 0100 yesterday morning. Had a gush of clear fluid but has not had any discharge since. States she's been having cramping and back pain since the gush of fluid. Denies VB. Endorses good fetal movement.   Pain score: 8 Vitals:   05/22/21 1458  Resp: 15  Temp: 98.7 F (37.1 C)  SpO2: 97%     FHT:142 Lab orders placed from triage:  UA

## 2021-05-22 NOTE — MAU Provider Note (Signed)
History     CSN: [id number]  Arrival date and time: 05/22/21 1440   Event Date/Time   First Provider Initiated Contact with Patient 05/22/21 1550      Chief Complaint  Patient presents with   Contractions   Back Pain   Danielle Shah is a 35 y.o. [id number] [facility] 32w3dwho [facility]  Danielle Shah presents today for Contractions and Back Pain.  Danielle Shah reports "pressure" in her lower back that Danielle Shah states felt like "contractions in my back" last night.  Patient states Danielle Shah is experiencing improved pain over the last 5 minutes as it was initially an 8, but is now a 6/10. Danielle Shah reports taking tylenol around 1130ish, but feels it didn't help.  also reports some cramping that has been "on and off for a week, but consistently all day."  Danielle Shah reports this is in the lower abdominal area.  Danielle Shah reports the cramping is not relieved with increased hydration or position change.  Danielle Shah endorses fetal movement and denies vaginal concerns including discharge, bleeding, or leaking. Danielle Shah states yesterday, around 0100, Danielle Shah had an incident of vomiting and had a "gush of fluid."  Danielle Shah states this is also a concern here today. Danielle Shah reports no other leaking.    OB History     Gravida  4   Para  1   Term  1   Preterm      AB  2   Living  1      SAB  1   IAB  1   Ectopic      Multiple  0   Live Births  1           Past Medical History:  Diagnosis Date   Anemia    Anxiety    Depression    [place]    Fibroid    Gestational diabetes    Obesity (BMI 30.0-34.9)     Past Surgical History:  Procedure Laterality Date   EYE SURGERY Right 2003   WISDOM TOOTH EXTRACTION  2004    Family History  Problem Relation Age of Onset   Heart disease Mother        CHF   Arthritis Mother    Hyperlipidemia Mother    Hypertension Mother     Social History   Tobacco Use   Smoking status: Former    Types: Cigarettes   Smokeless tobacco: Never  Vaping Use    Vaping Use: Never used  Substance Use Topics   Alcohol use: Not Currently    Comment: social drinking   Drug use: Not Currently    Types: Marijuana    Comment: last use thanksgiving    Allergies:  Allergies  Allergen Reactions   Penicillins Hives    Medications Prior to Admission  Medication Sig Dispense Refill Last Dose   Prenatal Vit-Fe Fumarate-FA (PRENATAL MULTIVITAMIN) TABS tablet Take 1 tablet by mouth daily at 12 noon.   05/22/2021   sertraline (ZOLOFT) 100 MG tablet Take 50 mg by mouth daily. 30 tablet 3 Past Week   amphetamine-dextroamphetamine (ADDERALL XR) 20 MG 24 hr capsule Take 1 capsule (20 mg total) by mouth daily. (Patient not taking: Reported on 05/10/2021) 30 capsule 0    amphetamine-dextroamphetamine (ADDERALL XR) 20 MG 24 hr capsule Take 1 capsule (20 mg total) by mouth every morning. 30 capsule 0    amphetamine-dextroamphetamine (ADDERALL XR) 20 MG 24 hr capsule Take 1 capsule (20 mg total) by  mouth every morning. 30 capsule 0    etonogestrel-ethinyl estradiol (NUVARING) 0.12-0.015 MG/24HR vaginal ring INSERT INTRAVAGINALY X 3 WEEKS THEN OUT X 1 WEEK (Patient not taking: No sig reported)       Review of Systems  Gastrointestinal:  Positive for abdominal pain (Cramping) and vomiting (Yesterday). Negative for nausea.  Genitourinary:  Negative for difficulty urinating and dysuria.  Musculoskeletal:  Positive for back pain (Lower).  Physical Exam   Blood pressure 124/82, pulse (!) 106, temperature 98.7 F (37.1 C), temperature source Oral, resp. rate 15, last menstrual period 08/22/2020, SpO2 97 %, currently breastfeeding.  Physical Exam Vitals reviewed. Exam conducted with a chaperone present.  HENT:     Head: Normocephalic and atraumatic.  Eyes:     Conjunctiva/sclera: Conjunctivae normal.  Cardiovascular:     Rate and Rhythm: Normal rate.     Heart sounds: Normal heart sounds.  Pulmonary:     Effort: Pulmonary effort is normal. No respiratory distress.   Abdominal:     General: Bowel sounds are normal.  Genitourinary:    Comments: Sterile Speculum Exam: -Normal External Genitalia: Non tender, Moderate amt milky discharge at introitus.  -Vaginal Vault: Pink mucosa with good rugae. Small amt milky white discharge-wet prep collected -Cervix:Pink, no lesions, cysts, or polyps.  Appears closed. No active bleeding from os-GC/CT collected -Bimanual Exam: Dilation: 1 Effacement (%): 50 Station: Ballotable Presentation: Vertex Exam by:: Gavin Pound, CNM Musculoskeletal:        General: Normal range of motion.     Cervical back: Normal range of motion.  Skin:    General: Skin is warm and dry.  Neurological:     Mental Status: Danielle Shah is alert and oriented to person, place, and time.  Psychiatric:        Mood and Affect: Mood normal.        Behavior: Behavior normal.        Thought Content: Thought content normal.    Fetal Assessment 145 bpm, Mod Var, -Decels, +Accels Toco: Irregular  MAU Course   Results for orders placed or performed during the hospital encounter of 05/22/21 (from the past 24 hour(s))  Urinalysis, Routine w reflex microscopic Urine, Clean Catch     Status: Abnormal   Collection Time: 05/22/21  3:15 PM  Result Value Ref Range   Color, Urine YELLOW YELLOW   APPearance HAZY (A) CLEAR   Specific Gravity, Urine 1.020 1.005 - 1.030   pH 6.0 5.0 - 8.0   Glucose, UA NEGATIVE NEGATIVE mg/dL   Hgb urine dipstick NEGATIVE NEGATIVE   Bilirubin Urine NEGATIVE NEGATIVE   Ketones, ur NEGATIVE NEGATIVE mg/dL   Protein, ur NEGATIVE NEGATIVE mg/dL   Nitrite NEGATIVE NEGATIVE   Leukocytes,Ua TRACE (A) NEGATIVE   RBC / HPF 6-10 0 - 5 RBC/hpf   WBC, UA 0-5 0 - 5 WBC/hpf   Bacteria, UA RARE (A) NONE SEEN   Squamous Epithelial / LPF 6-10 0 - 5   Mucus PRESENT   Wet prep, genital     Status: Abnormal   Collection Time: 05/22/21  4:21 PM   Specimen: Vaginal  Result Value Ref Range   Yeast Wet Prep HPF POC NONE SEEN NONE SEEN    Trich, Wet Prep NONE SEEN NONE SEEN   Clue Cells Wet Prep HPF POC NONE SEEN NONE SEEN   WBC, Wet Prep HPF POC MANY (A) NONE SEEN   Sperm NONE SEEN   POCT fern test     Status: None  Collection Time: 05/22/21  4:48 PM  Result Value Ref Range   POCT Fern Test Negative = intact amniotic membranes    No results found.  MDM PE Labs: UA, Fern Wet, Prep, GC/CT EFM Assessment and Plan  35 year old [id number]  SIUP at 36.3 weeks Cat I FT Back Pain Cramping R/O ROM  -Exam performed and findings discussed. -Informed that findings are not suggestive for ROM. Maryann Alar confirms with negative findings. -Cultures collected and pending.  -Will give flexeril and heating pad for back pain. -Will monitor and await results.  -NST Reactive. Okay to discontinue EFM. -Okay to eat.   Maryann Conners MSN, CNM 05/22/2021, 3:50 PM    Reassessment (5:06 PM)  -Wet prep returns without significant findings. -Patient reports improvement in back pain. -Requests rx ; Flexeril '10mg'$  sent to pharmacy on file.  -Encouraged to call or return to MAU if symptoms worsen or with the onset of new symptoms. -Discharged to home in stable condition.  Maryann Conners MSN, CNM Advanced Practice Provider, [facility] [employer] [facility]

## 2021-05-23 LAB — GC/CHLAMYDIA PROBE AMP (~~LOC~~) NOT AT ARMC
Chlamydia: NEGATIVE
Comment: NEGATIVE
Comment: NORMAL
Neisseria Gonorrhea: NEGATIVE

## 2021-05-25 DIAGNOSIS — E559 Vitamin D deficiency, unspecified: Secondary | ICD-10-CM | POA: Diagnosis not present

## 2021-05-25 DIAGNOSIS — R8279 Other abnormal findings on microbiological examination of urine: Secondary | ICD-10-CM | POA: Diagnosis not present

## 2021-05-25 DIAGNOSIS — F53 Postpartum depression: Secondary | ICD-10-CM | POA: Diagnosis not present

## 2021-05-25 DIAGNOSIS — Z8759 Personal history of other complications of pregnancy, childbirth and the puerperium: Secondary | ICD-10-CM | POA: Diagnosis not present

## 2021-05-25 DIAGNOSIS — Z369 Encounter for antenatal screening, unspecified: Secondary | ICD-10-CM | POA: Diagnosis not present

## 2021-05-25 DIAGNOSIS — F419 Anxiety disorder, unspecified: Secondary | ICD-10-CM | POA: Diagnosis not present

## 2021-05-25 DIAGNOSIS — O09512 Supervision of elderly primigravida, second trimester: Secondary | ICD-10-CM | POA: Diagnosis not present

## 2021-05-25 DIAGNOSIS — F329 Major depressive disorder, single episode, unspecified: Secondary | ICD-10-CM | POA: Diagnosis not present

## 2021-05-25 DIAGNOSIS — Z880 Allergy status to penicillin: Secondary | ICD-10-CM | POA: Diagnosis not present

## 2021-05-25 DIAGNOSIS — Z331 Pregnant state, incidental: Secondary | ICD-10-CM | POA: Diagnosis not present

## 2021-05-25 DIAGNOSIS — Z113 Encounter for screening for infections with a predominantly sexual mode of transmission: Secondary | ICD-10-CM | POA: Diagnosis not present

## 2021-05-25 DIAGNOSIS — F909 Attention-deficit hyperactivity disorder, unspecified type: Secondary | ICD-10-CM | POA: Diagnosis not present

## 2021-05-25 DIAGNOSIS — O24415 Gestational diabetes mellitus in pregnancy, controlled by oral hypoglycemic drugs: Secondary | ICD-10-CM | POA: Diagnosis not present

## 2021-05-25 DIAGNOSIS — D259 Leiomyoma of uterus, unspecified: Secondary | ICD-10-CM | POA: Diagnosis not present

## 2021-05-25 LAB — OB RESULTS CONSOLE GBS: GBS: NEGATIVE

## 2021-05-29 DIAGNOSIS — Z8759 Personal history of other complications of pregnancy, childbirth and the puerperium: Secondary | ICD-10-CM | POA: Diagnosis not present

## 2021-05-30 ENCOUNTER — Telehealth (HOSPITAL_COMMUNITY): Payer: Self-pay | Admitting: *Deleted

## 2021-05-30 NOTE — Telephone Encounter (Signed)
Preadmission screen  

## 2021-05-31 ENCOUNTER — Other Ambulatory Visit: Payer: Self-pay | Admitting: Obstetrics and Gynecology

## 2021-06-01 DIAGNOSIS — O24415 Gestational diabetes mellitus in pregnancy, controlled by oral hypoglycemic drugs: Secondary | ICD-10-CM | POA: Diagnosis not present

## 2021-06-01 DIAGNOSIS — Z3A37 37 weeks gestation of pregnancy: Secondary | ICD-10-CM | POA: Diagnosis not present

## 2021-06-02 ENCOUNTER — Inpatient Hospital Stay (HOSPITAL_COMMUNITY): Payer: Medicaid Other | Admitting: Anesthesiology

## 2021-06-02 ENCOUNTER — Encounter (HOSPITAL_COMMUNITY): Payer: Self-pay | Admitting: Obstetrics & Gynecology

## 2021-06-02 ENCOUNTER — Other Ambulatory Visit: Payer: Self-pay

## 2021-06-02 ENCOUNTER — Inpatient Hospital Stay (HOSPITAL_COMMUNITY)
Admission: AD | Admit: 2021-06-02 | Discharge: 2021-06-03 | DRG: 807 | Disposition: A | Discharge status: Home / Self Care | Payer: Medicaid Other | Attending: Obstetrics and Gynecology | Admitting: Obstetrics and Gynecology

## 2021-06-02 DIAGNOSIS — Z3A38 38 weeks gestation of pregnancy: Secondary | ICD-10-CM

## 2021-06-02 DIAGNOSIS — Z20822 Contact with and (suspected) exposure to covid-19: Secondary | ICD-10-CM | POA: Diagnosis not present

## 2021-06-02 DIAGNOSIS — F909 Attention-deficit hyperactivity disorder, unspecified type: Secondary | ICD-10-CM | POA: Diagnosis present

## 2021-06-02 DIAGNOSIS — F419 Anxiety disorder, unspecified: Secondary | ICD-10-CM | POA: Diagnosis present

## 2021-06-02 DIAGNOSIS — O26893 Other specified pregnancy related conditions, third trimester: Secondary | ICD-10-CM | POA: Diagnosis not present

## 2021-06-02 DIAGNOSIS — Z880 Allergy status to penicillin: Secondary | ICD-10-CM

## 2021-06-02 DIAGNOSIS — Z87891 Personal history of nicotine dependence: Secondary | ICD-10-CM

## 2021-06-02 DIAGNOSIS — F32A Depression, unspecified: Secondary | ICD-10-CM | POA: Diagnosis not present

## 2021-06-02 DIAGNOSIS — O99344 Other mental disorders complicating childbirth: Secondary | ICD-10-CM | POA: Diagnosis not present

## 2021-06-02 DIAGNOSIS — D259 Leiomyoma of uterus, unspecified: Secondary | ICD-10-CM | POA: Diagnosis not present

## 2021-06-02 DIAGNOSIS — Z79899 Other long term (current) drug therapy: Secondary | ICD-10-CM | POA: Diagnosis not present

## 2021-06-02 DIAGNOSIS — O99214 Obesity complicating childbirth: Secondary | ICD-10-CM | POA: Diagnosis not present

## 2021-06-02 DIAGNOSIS — O24425 Gestational diabetes mellitus in childbirth, controlled by oral hypoglycemic drugs: Principal | ICD-10-CM | POA: Diagnosis present

## 2021-06-02 DIAGNOSIS — O3413 Maternal care for benign tumor of corpus uteri, third trimester: Secondary | ICD-10-CM | POA: Diagnosis not present

## 2021-06-02 LAB — TYPE AND SCREEN
ABO/RH(D): O POS
Antibody Screen: NEGATIVE

## 2021-06-02 LAB — CBC
HCT: 34 % — ABNORMAL LOW (ref 36.0–46.0)
Hemoglobin: 11.3 g/dL — ABNORMAL LOW (ref 12.0–15.0)
MCH: 31.9 pg (ref 26.0–34.0)
MCHC: 33.2 g/dL (ref 30.0–36.0)
MCV: 96 fL (ref 80.0–100.0)
Platelets: 326 10*3/uL (ref 150–400)
RBC: 3.54 MIL/uL — ABNORMAL LOW (ref 3.87–5.11)
RDW: 13.6 % (ref 11.5–15.5)
WBC: 11.5 10*3/uL — ABNORMAL HIGH (ref 4.0–10.5)
nRBC: 0 % (ref 0.0–0.2)

## 2021-06-02 LAB — RESP PANEL BY RT-PCR (FLU A&B, COVID) ARPGX2
Influenza A by PCR: NEGATIVE
Influenza B by PCR: NEGATIVE
SARS Coronavirus 2 by RT PCR: NEGATIVE

## 2021-06-02 LAB — RPR: RPR Ser Ql: NONREACTIVE

## 2021-06-02 LAB — GLUCOSE, CAPILLARY
Glucose-Capillary: 96 mg/dL (ref 70–99)
Glucose-Capillary: 167 mg/dL — ABNORMAL HIGH (ref 70–99)
Glucose-Capillary: 103 mg/dL — ABNORMAL HIGH (ref 70–99)

## 2021-06-02 MED ORDER — OXYCODONE-ACETAMINOPHEN 5-325 MG PO TABS: 2.0000 | ORAL_TABLET | ORAL | Status: DC | PRN

## 2021-06-02 MED ORDER — LACTATED RINGERS IV SOLN: 500.0000 mL | INTRAVENOUS | Status: DC | PRN

## 2021-06-02 MED ORDER — BENZOCAINE-MENTHOL 20-0.5 % EX AERO
1.0000 "application " | INHALATION_SPRAY | CUTANEOUS | Status: DC | PRN
  Filled 2021-06-02: qty 56

## 2021-06-02 MED ORDER — CYCLOBENZAPRINE HCL 10 MG PO TABS: 10.0000 mg | ORAL_TABLET | Freq: Two times a day (BID) | ORAL | Status: DC | PRN

## 2021-06-02 MED ORDER — OXYTOCIN-SODIUM CHLORIDE 30-0.9 UT/500ML-% IV SOLN
2.5000 [IU]/h | INTRAVENOUS | Status: DC
  Administered 2021-06-02: 2.5 [IU]/h via INTRAVENOUS
  Filled 2021-06-02: qty 500

## 2021-06-02 MED ORDER — SOD CITRATE-CITRIC ACID 500-334 MG/5ML PO SOLN: 30.0000 mL | ORAL | Status: DC | PRN

## 2021-06-02 MED ORDER — PHENYLEPHRINE 40 MCG/ML (10ML) SYRINGE FOR IV PUSH (FOR BLOOD PRESSURE SUPPORT): 80.0000 ug | PREFILLED_SYRINGE | INTRAVENOUS | Status: DC | PRN

## 2021-06-02 MED ORDER — DIPHENHYDRAMINE HCL 50 MG/ML IJ SOLN: 12.5000 mg | INTRAMUSCULAR | Status: DC | PRN

## 2021-06-02 MED ORDER — IBUPROFEN 600 MG PO TABS
600.0000 mg | ORAL_TABLET | Freq: Four times a day (QID) | ORAL | Status: DC
  Administered 2021-06-02 – 2021-06-03 (×4): 600 mg via ORAL
  Filled 2021-06-02 (×5): qty 1

## 2021-06-02 MED ORDER — LACTATED RINGERS IV SOLN: INTRAVENOUS | Status: DC

## 2021-06-02 MED ORDER — ONDANSETRON HCL 4 MG PO TABS: 4.0000 mg | ORAL_TABLET | ORAL | Status: DC | PRN

## 2021-06-02 MED ORDER — SIMETHICONE 80 MG PO CHEW: 80.0000 mg | CHEWABLE_TABLET | ORAL | Status: DC | PRN

## 2021-06-02 MED ORDER — WITCH HAZEL-GLYCERIN EX PADS: 1.0000 "application " | MEDICATED_PAD | CUTANEOUS | Status: DC | PRN

## 2021-06-02 MED ORDER — ZOLPIDEM TARTRATE 5 MG PO TABS: 5.0000 mg | ORAL_TABLET | Freq: Every evening | ORAL | Status: DC | PRN

## 2021-06-02 MED ORDER — SERTRALINE HCL 50 MG PO TABS
50.0000 mg | ORAL_TABLET | Freq: Every day | ORAL | Status: DC
  Administered 2021-06-02 – 2021-06-03 (×2): 50 mg via ORAL
  Filled 2021-06-02 (×2): qty 1

## 2021-06-02 MED ORDER — COCONUT OIL OIL: 1.0000 "application " | TOPICAL_OIL | Status: DC | PRN

## 2021-06-02 MED ORDER — DIPHENHYDRAMINE HCL 25 MG PO CAPS: 25.0000 mg | ORAL_CAPSULE | Freq: Four times a day (QID) | ORAL | Status: DC | PRN

## 2021-06-02 MED ORDER — ACETAMINOPHEN 325 MG PO TABS: 650.0000 mg | ORAL_TABLET | ORAL | Status: DC | PRN

## 2021-06-02 MED ORDER — SENNOSIDES-DOCUSATE SODIUM 8.6-50 MG PO TABS
2.0000 | ORAL_TABLET | Freq: Every day | ORAL | Status: DC
  Administered 2021-06-03: 2 via ORAL
  Filled 2021-06-02: qty 2

## 2021-06-02 MED ORDER — LIDOCAINE HCL (PF) 1 % IJ SOLN
30.0000 mL | INTRAMUSCULAR | Status: DC | PRN
Start: 2021-06-02 — End: 2021-06-02

## 2021-06-02 MED ORDER — ONDANSETRON HCL 4 MG/2ML IJ SOLN: 4.0000 mg | INTRAMUSCULAR | Status: DC | PRN

## 2021-06-02 MED ORDER — FLEET ENEMA 7-19 GM/118ML RE ENEM: 1.0000 | ENEMA | RECTAL | Status: DC | PRN

## 2021-06-02 MED ORDER — LACTATED RINGERS IV SOLN: 500.0000 mL | Freq: Once | INTRAVENOUS | Status: DC

## 2021-06-02 MED ORDER — EPHEDRINE 5 MG/ML INJ: 10.0000 mg | INTRAVENOUS | Status: DC | PRN

## 2021-06-02 MED ORDER — HYDROXYZINE HCL 25 MG PO TABS: 25.0000 mg | ORAL_TABLET | Freq: Three times a day (TID) | ORAL | Status: DC | PRN

## 2021-06-02 MED ORDER — ONDANSETRON HCL 4 MG/2ML IJ SOLN: 4.0000 mg | Freq: Four times a day (QID) | INTRAMUSCULAR | Status: DC | PRN

## 2021-06-02 MED ORDER — PRENATAL MULTIVITAMIN CH
1.0000 | ORAL_TABLET | Freq: Every day | ORAL | Status: DC
  Administered 2021-06-02 – 2021-06-03 (×2): 1 via ORAL
  Filled 2021-06-02 (×2): qty 1

## 2021-06-02 MED ORDER — OXYTOCIN BOLUS FROM INFUSION
333.0000 mL | Freq: Once | INTRAVENOUS | Status: AC
  Administered 2021-06-02: 333 mL via INTRAVENOUS

## 2021-06-02 MED ORDER — LIDOCAINE HCL (PF) 1 % IJ SOLN
INTRAMUSCULAR | Status: DC | PRN
  Administered 2021-06-02: 5 mL via EPIDURAL
  Administered 2021-06-02: 4 mL via EPIDURAL

## 2021-06-02 MED ORDER — DIBUCAINE (PERIANAL) 1 % EX OINT: 1.0000 "application " | TOPICAL_OINTMENT | CUTANEOUS | Status: DC | PRN

## 2021-06-02 MED ORDER — ACETAMINOPHEN 325 MG PO TABS
650.0000 mg | ORAL_TABLET | ORAL | Status: DC | PRN
  Administered 2021-06-02 – 2021-06-03 (×3): 650 mg via ORAL
  Filled 2021-06-02 (×3): qty 2

## 2021-06-02 MED ORDER — FENTANYL-BUPIVACAINE-NACL 0.5-0.125-0.9 MG/250ML-% EP SOLN
12.0000 mL/h | EPIDURAL | Status: DC | PRN
  Administered 2021-06-02: 12 mL/h via EPIDURAL
  Filled 2021-06-02: qty 250

## 2021-06-02 MED ORDER — LACTATED RINGERS IV SOLN
500.0000 mL | Freq: Once | INTRAVENOUS | Status: AC
  Administered 2021-06-02: 500 mL via INTRAVENOUS

## 2021-06-02 MED ORDER — FENTANYL CITRATE (PF) 100 MCG/2ML IJ SOLN
50.0000 ug | INTRAMUSCULAR | Status: DC | PRN
  Administered 2021-06-02: 100 ug via INTRAVENOUS
  Filled 2021-06-02: qty 2

## 2021-06-02 MED ORDER — OXYCODONE-ACETAMINOPHEN 5-325 MG PO TABS: 1.0000 | ORAL_TABLET | ORAL | Status: DC | PRN

## 2021-06-02 MED ORDER — TETANUS-DIPHTH-ACELL PERTUSSIS 5-2.5-18.5 LF-MCG/0.5 IM SUSY: 0.5000 mL | PREFILLED_SYRINGE | Freq: Once | INTRAMUSCULAR | Status: DC

## 2021-06-02 NOTE — Lactation Note (Signed)
This note was copied from a baby's chart. Lactation Consultation Note  Patient Name: Danielle Shah [id number] Date: 06/02/2021 Reason for consult: L&D Initial assessment Age:36 hours  RN assisted with latching and hand expression. Lactation will follow up on MBU.   Feeding Mother's Current Feeding Choice: Breast Milk  LATCH Score Latch: Grasps breast easily, tongue down, lips flanged, rhythmical sucking.  Audible Swallowing: A few with stimulation  Type of Nipple: Everted at rest and after stimulation  Comfort (Breast/Nipple): Soft / non-tender  Hold (Positioning): No assistance needed to correctly position infant at breast.  LATCH Score: 9   Consult Status Consult Status: Follow-up from L&D    [name] [facility] 06/02/2021, 10:24 AM

## 2021-06-02 NOTE — Anesthesia Procedure Notes (Signed)
Epidural Patient location during procedure: OB Start time: 06/02/2021 7:10 AM End time: 06/02/2021 7:13 AM  Staffing Anesthesiologist: Audry Pili, MD Performed: anesthesiologist   Preanesthetic Checklist Completed: patient identified, IV checked, risks and benefits discussed, monitors and equipment checked, pre-op evaluation and timeout performed  Epidural Patient position: sitting Prep: DuraPrep Patient monitoring: continuous pulse ox and blood pressure Approach: midline Location: L2-L3 Injection technique: LOR saline  Needle:  Needle type: Tuohy  Needle gauge: 17 G Needle length: 9 cm Needle insertion depth: 6 cm Catheter size: 19 Gauge Catheter at skin depth: 11 cm Test dose: negative and Other (1% lidocaine)  Assessment Events: blood not aspirated  Additional Notes Patient identified. Risks including, but not limited to, bleeding, infection, nerve damage, paralysis, inadequate analgesia, blood pressure changes, nausea, vomiting, allergic reaction, postpartum back pain, itching, and headache were discussed. Patient expressed understanding and wished to proceed. Sterile prep and drape, including hand hygiene, mask, and sterile gloves were used. The patient was positioned and the spine was prepped. The skin was anesthetized with lidocaine. No paraesthesia or other complication noted. The patient did not experience any signs of intravascular injection such as tinnitus or metallic taste in mouth, nor signs of intrathecal spread such as rapid motor block. Please see nursing notes for vital signs. The patient tolerated the procedure well.   Renold Don, MDReason for block:procedure for pain

## 2021-06-02 NOTE — Anesthesia Preprocedure Evaluation (Addendum)
Anesthesia Evaluation  Patient identified by MRN, date of birth, ID band Patient awake    Reviewed: Allergy & Precautions, H&P , NPO status , Patient's Chart, lab work & pertinent test results  Airway Mallampati: III   Neck ROM: full   Comment: Poor effort on mallampati exam, suspect can open mouth wider Dental   Pulmonary former smoker,    breath sounds clear to auscultation       Cardiovascular negative cardio ROS   Rhythm:Regular Rate:Normal     Neuro/Psych PSYCHIATRIC DISORDERS Anxiety Depression negative neurological ROS     GI/Hepatic negative GI ROS, Neg liver ROS,   Endo/Other   Obesity   Renal/GU negative Renal ROS     Musculoskeletal negative musculoskeletal ROS (+)   Abdominal   Peds  Hematology   Anesthesia Other Findings   Reproductive/Obstetrics (+) Pregnancy  fibroid                           Anesthesia Physical Anesthesia Plan  ASA: 2  Anesthesia Plan: Epidural   Post-op Pain Management:    Induction: Intravenous  PONV Risk Score and Plan: 2 and Treatment may vary due to age or medical condition  Airway Management Planned: Natural Airway  Additional Equipment: None  Intra-op Plan:   Post-operative Plan:   Informed Consent: I have reviewed the patients History and Physical, chart, labs and discussed the procedure including the risks, benefits and alternatives for the proposed anesthesia with the patient or authorized representative who has indicated his/her understanding and acceptance.     Dental advisory given  Plan Discussed with: Anesthesiologist  Anesthesia Plan Comments: (Labs reviewed. Platelets acceptable, patient not taking any blood thinning medications. Per RN, FHR tracing reported to be stable enough for sitting procedure. Risks and benefits discussed with patient, including PDPH, backache, epidural hematoma, failed epidural, blood pressure  changes, allergic reaction, and nerve injury. Patient expressed understanding and wished to proceed.)       Anesthesia Quick Evaluation

## 2021-06-02 NOTE — Lactation Note (Signed)
This note was copied from a baby's chart. Lactation Consultation Note  Patient Name: Danielle Shah [id number] Date: 06/02/2021 Reason for consult: Initial assessment;Early term 37-38.6wks Age:36 hours   P2 mother whose infant is now 70 hours old.  This is an ETI at 38+0 weeks.  Mother breast fed her first child (now 30 year old) for 6 months.  Mother reported that baby has latched and fed for 10 minutes before falling asleep.  Reviewed breast feeding basics and encouraged STS.  Mother is familiar with hand expression and did not desire any review.  Spoon provided.  Mother will feed 8-12 times/24 hours or sooner if baby shows feeding cues.  Offered to return for latch assistance as desired.  Mom made aware of O/P services, breastfeeding support groups, community resources, and our phone # for post-discharge questions.  Mother has a DEBP for home use.  Father present and reports that he feels very comfortable with handling baby and had no questions.     Maternal Data Has patient been taught Hand Expression?: Yes Does the patient have breastfeeding experience prior to this delivery?: Yes How long did the patient breastfeed?: 6 months  Feeding Mother's Current Feeding Choice: Breast Milk  LATCH Score Latch: Grasps breast easily, tongue down, lips flanged, rhythmical sucking.  Audible Swallowing: A few with stimulation  Type of Nipple: Everted at rest and after stimulation  Comfort (Breast/Nipple): Soft / non-tender  Hold (Positioning): No assistance needed to correctly position infant at breast.  LATCH Score: 9   Lactation Tools Discussed/Used    Interventions Interventions: Breast feeding basics reviewed  Discharge Pump: Personal  Consult Status Consult Status: Follow-up Date: 06/03/21 Follow-up type: In-patient    Misao Fackrell R Brittiney Dicostanzo 06/02/2021, 12:08 PM

## 2021-06-02 NOTE — H&P (Signed)
OB ADMISSION/ HISTORY & PHYSICAL:  Admission Date: 06/02/2021  4:17 AM  Admit Diagnosis: Normal labor  Danielle Shah is a 35 y.o. female [id number] 63w0dpresenting for LOF. Endorses active FM, denies vaginal bleeding. Preg complicated by [id number] by Glyburide, ADHD managed on Adderall 10 mg PO daily, and anxiety and depression managed on Zoloft 100 mg PO daily.   History of current pregnancy: [id number]  Patient entered care with [facility] at 12+1 wks.   EDC by LMP and congruent w/ 9+4 wk U/S.   Anatomy scan:  complete w/ anterior placenta.   Antenatal testing: for AMA  Last evaluation: 34  wks vtx, anterior placenta, AFI 13.46, 46%ile, EFW 5+12, 59%ile Significant prenatal events:  Patient Active Problem List   Diagnosis Date Noted   Normal labor 06/02/2021   ADHD (attention deficit hyperactivity disorder) 01/31/2018   Anxiety and depression 12/14/2017   [place], major, [place] episode, severe [place] 12/14/[place]   Anxiety 12/14/2017   Hyperlipidemia, mild 11/29/2013   Overweight (BMI 25.0-29.9) 11/11/2009   Herpes simplex virus (HSV) infection 10/09/2008    Prenatal Labs: ABO, Rh: --/--/PENDING (08/26 [id number] Antibody: PENDING (08/26 0517) Rubella: Immune (02/11 0000)  RPR: Nonreactive (02/11 0000)  HBsAg: Negative (02/11 0000)  HIV: Non-reactive (02/11 0000)  GTT: presumptive GDM, pt declined GBS: Negative/-- (08/18 0000)  GC/CHL: neg/neg Genetics: low-risk female Tdap/influenza vaccines: UTD on bth   OB History  Gravida Para Term Preterm AB Living  '4 1 1   2 1  '$ SAB IAB Ectopic Multiple Live Births  1 1   0 1    # Outcome Date GA Lbr Len/2nd Weight Sex Delivery Anes PTL Lv  4 Current           3 Term 03/28/20 38w0d8:12 / 00:41 3235 g F Vag-Spont EPI  LIV  2 SAB           1 IAB             Medical / Surgical History: Past medical history:  Past Medical History:  Diagnosis Date   Anemia    Anxiety    Depression    Fibroid    Obesity (BMI 30.0-34.9)      Past surgical history:  Past Surgical History:  Procedure Laterality Date   EYE SURGERY Right 2003   WISDOM TOOTH EXTRACTION  2004   Family History:  Family History  Problem Relation Age of Onset   Heart disease Mother        CHF   Arthritis Mother    Hyperlipidemia Mother    Hypertension Mother     Social History:  reports that she has quit smoking. Her smoking use included cigarettes. She has never used smokeless tobacco. She reports that she does not currently use alcohol. She reports that she does not currently use drugs after having used the following drugs: Marijuana.  Allergies: Penicillins   Current Medications at time of admission:  Prior to Admission medications   Medication Sig Start Date End Date Taking? Authorizing Provider  cyclobenzaprine (FLEXERIL) 10 MG tablet Take 1 tablet (10 mg total) by mouth 2 (two) times daily as needed for muscle spasms. 05/22/21  Yes EmGavin PoundCNM  Prenatal Vit-Fe Fumarate-FA (PRENATAL MULTIVITAMIN) TABS tablet Take 1 tablet by mouth daily at 12 noon.   Yes [provider]  sertraline (ZOLOFT) 100 MG tablet Take 50 mg by mouth daily. 10/19/20  Yes SiFayrene HelperMD    Review of Systems: Constitutional: Negative  HENT: Negative   Eyes: Negative   Respiratory: Negative   Cardiovascular: Negative   Gastrointestinal: Negative  Genitourinary: pos for bloody show, pos for LOF   Musculoskeletal: Negative   Skin: Negative   Neurological: Negative   Endo/Heme/Allergies: Negative   Psychiatric/Behavioral: Negative    Physical Exam: VS: Blood pressure 116/86, pulse (!) 104, temperature 98.2 F (36.8 C), temperature source Oral, resp. rate 18, last menstrual period 08/22/2020, SpO2 99 %, currently breastfeeding. AAO x3, no signs of distress Cardiovascular: RRR Respiratory: Lung fields clear to ausculation GU/GI: Abdomen gravid, non-tender, non-distended, active FM, vertex Extremities: trace edema, negative for pain,  tenderness, and cords  Cervical exam:Dilation: 4 Effacement (%): 80 Station: -2 Exam by:: M.Williams,CNM FHR: baseline rate 135 / variability moderate / accelerations present / absent decelerations TOCO: 3-4 min   Prenatal Transfer Tool  Maternal Diabetes: Yes:  Diabetes Type:  Insulin/Medication controlled Genetic Screening: Normal Maternal Ultrasounds/Referrals: Normal Fetal Ultrasounds or other Referrals:  None Maternal Substance Abuse:  No Significant Maternal Medications:  Meds include: Zoloft Other: Adderall Significant Maternal Lab Results: Group B Strep negative    Assessment: 35 y.o. [id number] 85w0dSROM A2DM Latent stage of labor FHR category 1 GBS neg 05/25/21 Pain management plan: epidural   Plan:  Admit to L&D Routine admission orders Epidural PRN Expectant management CBG Q 4 hrs in labor Dr KAlesia Richardsnotified of admission and plan of care  VArrie EasternMSN, CNM 06/02/2021 6:24 AM

## 2021-06-03 LAB — CBC
HCT: 32.7 % — ABNORMAL LOW (ref 36.0–46.0)
Hemoglobin: 10.9 g/dL — ABNORMAL LOW (ref 12.0–15.0)
MCH: 31.6 pg (ref 26.0–34.0)
MCHC: 33.3 g/dL (ref 30.0–36.0)
MCV: 94.8 fL (ref 80.0–100.0)
Platelets: 314 10*3/uL (ref 150–400)
RBC: 3.45 MIL/uL — ABNORMAL LOW (ref 3.87–5.11)
RDW: 13.4 % (ref 11.5–15.5)
WBC: 13.5 10*3/uL — ABNORMAL HIGH (ref 4.0–10.5)
nRBC: 0 % (ref 0.0–0.2)

## 2021-06-03 LAB — GLUCOSE, CAPILLARY
Glucose-Capillary: 85 mg/dL (ref 70–99)
Glucose-Capillary: 102 mg/dL — ABNORMAL HIGH (ref 70–99)

## 2021-06-03 MED ORDER — SERTRALINE HCL 50 MG PO TABS: 50.0000 mg | ORAL_TABLET | Freq: Every day | ORAL | 0 refills | Status: DC

## 2021-06-03 MED ORDER — IBUPROFEN 600 MG PO TABS: 600.0000 mg | ORAL_TABLET | Freq: Four times a day (QID) | ORAL | 0 refills | Status: DC

## 2021-06-03 NOTE — Anesthesia Postprocedure Evaluation (Signed)
Anesthesia Post Note  Patient: Danielle Shah  Procedure(s) Performed: AN AD Stuart     Patient location during evaluation: Mother Baby Anesthesia Type: Epidural Level of consciousness: awake, oriented and awake and alert Pain management: pain level controlled Vital Signs Assessment: post-procedure vital signs reviewed and stable Respiratory status: respiratory function stable, spontaneous breathing and nonlabored ventilation Cardiovascular status: stable Postop Assessment: no headache, adequate PO intake, able to ambulate, patient able to bend at knees, no backache and no apparent nausea or vomiting Anesthetic complications: no   No notable events documented.  Last Vitals:  Vitals:   06/02/21 2330 06/03/21 0620  BP: 116/72 114/79  Pulse: 92 82  Resp: 18 17  Temp: 36.8 C 36.7 C  SpO2: 97% 98%    Last Pain:  Vitals:   06/03/21 0620  TempSrc: Oral  PainSc: 0-No pain   Pain Goal:                   [name]

## 2021-06-03 NOTE — Discharge Summary (Signed)
SVD OB Discharge Summary     Patient Name: Danielle Shah DOB: Mar 02, 1986 MRN: [id number]  Date of admission: 06/02/2021 Delivering MD: Noralyn Pick  Date of delivery: 06/02/2021 Type of delivery: SVD  Newborn Data: Sex: Baby female Live born female  Birth Weight: 6 lb 9.3 oz (2985 g) APGAR: 31, 9  Newborn Delivery   Birth date/time: 06/02/2021 08:43:00 Delivery type: Vaginal, Spontaneous      Feeding: breast Infant being discharge to home with mother in stable condition.   Admitting diagnosis: Normal labor [O80, Z37.9] Intrauterine pregnancy: [redacted]w[redacted]d    Secondary diagnosis:  Active Problems:   Normal labor   SVD (spontaneous vaginal delivery)   Normal postpartum course                                Complications: None                                                              Intrapartum Procedures: spontaneous vaginal delivery Postpartum Procedures: none Complications-Operative and Postpartum: none Augmentation: N/A   History of Present Illness: Ms. JMARYHELEN MCELENEYis a 35y.o. female, [id number] who presents at 331w0deeks gestation. [facility]  Her pregnancy has been complicated by:  Patient Active Problem List   Diagnosis Date Noted   Normal labor 06/02/2021   SVD (spontaneous vaginal delivery) 06/02/2021   Normal postpartum course 06/02/2021   ADHD (attention deficit hyperactivity disorder) 01/31/2018   Anxiety and depression 12/14/2017   Depression, major, single episode, severe ([place]/06/2018   Anxiety 12/14/2017   Hyperlipidemia, mild 11/29/2013   Overweight (BMI 25.0-29.9) 11/11/2009   Herpes simplex virus (HSV) infection 10/09/2008     Active Ambulatory Problems    Diagnosis Date Noted   Herpes simplex virus (HSV) infection 10/09/2008   Overweight (BMI 25.0-29.9) 11/11/2009   Hyperlipidemia, mild 11/29/2013   Anxiety and depression 12/14/2017   Depression, major, single  episode, severe ([place]/06/2018   Anxiety 12/14/2017   ADHD (attention deficit hyperactivity disorder) 01/31/2018   Resolved Ambulatory Problems    Diagnosis Date Noted   IR[place] DEFIC [place] 04/23/2008   ADHD 04/23/2008   Acute sinusitis, unspecified 01/13/2010   Acute pharyngitis 01/13/2010   Acute bronchitis 01/13/2010   Allergic rhinitis 04/23/2008   DYSPEPSIA 10/07/2008   FIBROCYSTIC BREAST DISEASE 04/23/2008   VAGINITIS 10/18/2008   DERMATITIS, ATOPIC 10/09/2008   Fatigue 11/11/2009   NAUSEA AND VOMITING 10/18/2008   Contraception management 04/12/2015   Irregular menstrual bleeding 08/04/2018   Flu-like symptoms 10/31/2018   Normal labor and delivery 03/28/2020   Normal postpartum course 03/30/2020   SVD (spontaneous vaginal delivery) 03/30/2020   Past Medical History:  Diagnosis Date   Anemia    Depression    Fibroid    Obesity (BMI 30.0-34.9)      Hospital course:  Onset of Labor With Vaginal Delivery      89.o. yo [id number] 3856w0ds admitted in Latent Labor on 06/02/2021. Patient had an uncomplicated labor course as follows:  Membrane Rupture Time/Date: 3:00 AM ,06/02/2021   Delivery Method:Vaginal, Spontaneous  Episiotomy: None  Lacerations:  None  Patient had an uncomplicated postpartum course.  She is ambulating, tolerating a regular diet, passing flatus, and urinating well. Patient is discharged home in stable condition on 06/03/21.  Newborn Data: Birth date:06/02/2021  Birth time:8:43 AM  Gender:Female  Living status:Living  Apgars:8 ,9  Weight:2985 g  Postpartum Day # 1 : S/P NSVD due to pt was admitted on 8/26 for spontaneous labor with SROM,  pt progressed to SVD on 8/26 @ 0843 over intact perineum, with ebl of 196ms, hgb drop of 11.3-10.9. Pt has h/o hsv+ no lesion no valtrex, h/o anxiety, depression on zoloft '50mg'$  PO QD will continues, h/o adhd, took adderral during pregnancy last dose was 06/01/2021 at 1000am '10mg'$  PO, plans on  not taking during breastfeeding. Plan to breastfeeding and declined needing medication during breastfeeding. H/O GDMA2 and declined GTT during this pregnancy, admitted CBG was 80, today fasting was 85, 2H PP was 102, pt will f/u 6 weeks PP for 2H GTT. Patient up ad lib, denies syncope or dizziness. Reports consuming regular diet without issues and denies N/V. Patient reports 0 bowel movement + passing flatus.  Denies issues with urination and reports bleeding is "lighter."  Patient is breast feeding and reports going well.  Desires undecided for postpartum contraception.  Pain is being appropriately managed with use of po meds. Meets criteria for early discharges and desires to go home in stable condition.   Physical exam  Vitals:   06/02/21 1520 06/02/21 1930 06/02/21 2330 06/03/21 0620  BP: 128/85 114/80 116/72 114/79  Pulse: 84 88 92 82  Resp: '18 18 18 17  '$ Temp: (!) 97.4 F (36.3 C) 98.4 F (36.9 C) 98.3 F (36.8 C) 98.1 F (36.7 C)  TempSrc: Oral Oral Oral Oral  SpO2:  98% 97% 98%  Weight:      Height:       General: alert, cooperative, and no distress Lochia: appropriate Uterine Fundus: firm Perineum: intact DVT Evaluation: No evidence of DVT seen on physical exam. Negative Homan's sign. No cords or calf tenderness. No significant calf/ankle edema.  Labs: Lab Results  Component Value Date   WBC 13.5 (H) 06/03/2021   HGB 10.9 (L) 06/03/2021   HCT 32.7 (L) 06/03/2021   MCV 94.8 06/03/2021   PLT 314 06/03/2021   CMP Latest Ref Rng & Units 04/01/2020  Glucose 70 - 99 mg/dL 85  BUN 6 - 20 mg/dL 7  Creatinine 0.44 - 1.00 mg/dL 0.55  Sodium 135 - 145 mmol/L 139  Potassium 3.5 - 5.1 mmol/L 3.7  Chloride 98 - 111 mmol/L 108  CO2 22 - 32 mmol/L 22  Calcium 8.9 - 10.3 mg/dL 8.7(L)  Total Protein 6.5 - 8.1 g/dL 5.9(L)  Total Bilirubin 0.3 - 1.2 mg/dL 0.2(L)  Alkaline Phos 38 - 126 U/L 130(H)  AST 15 - 41 U/L 50(H)  ALT 0 - 44 U/L 46(H)    Date of discharge:  06/03/2021 Discharge Diagnoses: Term Pregnancy-delivered Discharge instruction: per After Visit Summary and "Baby and Me Booklet".  After visit meds:   Activity:           unrestricted and pelvic rest Advance as tolerated. Pelvic rest for 6 weeks.  Diet:                routine Medications: PNV and Ibuprofen Postpartum contraception: Undecided Condition:  Pt discharge to home with baby in stable and condition  Meds: Allergies as of 06/03/2021       Reactions   Penicillins Hives  Medication List     STOP taking these medications    amphetamine-dextroamphetamine 10 MG 24 hr capsule Commonly known as: ADDERALL XR       TAKE these medications    cyclobenzaprine 10 MG tablet Commonly known as: FLEXERIL Take 1 tablet (10 mg total) by mouth 2 (two) times daily as needed for muscle spasms.   ibuprofen 600 MG tablet Commonly known as: ADVIL Take 1 tablet (600 mg total) by mouth every 6 (six) hours.   prenatal multivitamin Tabs tablet Take 1 tablet by mouth daily at 12 noon.   sertraline 50 MG tablet Commonly known as: Zoloft Take 1 tablet (50 mg total) by mouth daily. May increase to '75mg'$  PO daily for two weeks and again to '100mg'$  PO if needed for depression and anxiety. What changed:  medication strength additional instructions        Discharge Follow Up:   Follow-up [facility]. Schedule an appointment as soon as possible for a visit.   Specialty: Obstetrics and Gynecology Why: 2 week Mood check, and 6 weeks PPV Contact information: [id number] [place] [phone] [place], NP-C, CNM 06/03/2021, 1:22 PM  Noralyn Pick, [place]

## 2021-06-03 NOTE — Progress Notes (Signed)
Danielle Shah was referred for history of depression/anxiety. * Referral screened out by Clinical Social Worker because none of the following criteria appear to apply:  ~ History of anxiety/depression during this pregnancy, or of post-partum depression following prior delivery. ~ Diagnosis of anxiety and/or depression within last 3 years OR * Danielle Shah's symptoms currently being treated with medication and/or therapy. Per chart, Danielle Shah's symptoms are currently being treated with Zoloft.   Please contact the Clinical Social Worker if needs arise, by [facility] request, or if Danielle Shah scores greater than 9 or yes to question 10 on [place] Postpartum Depression Screen.   Darcus Austin, MSW, LCSW-A Clinical Social Worker- Weekends [phone]

## 2021-06-07 ENCOUNTER — Ambulatory Visit: Payer: Medicaid Other

## 2021-06-09 ENCOUNTER — Inpatient Hospital Stay (HOSPITAL_COMMUNITY): Payer: Medicaid Other

## 2021-06-09 ENCOUNTER — Inpatient Hospital Stay (HOSPITAL_COMMUNITY)
Admission: AD | Admit: 2021-06-09 | Payer: Medicaid Other | Source: Home / Self Care | Admitting: Obstetrics and Gynecology

## 2021-06-14 ENCOUNTER — Telehealth (HOSPITAL_COMMUNITY): Payer: Self-pay | Admitting: *Deleted

## 2021-06-14 NOTE — Telephone Encounter (Signed)
Attempted hospital discharge follow-up call. Left message for patient to return RN call. Erline Levine, RN, 06/14/21, [phone].

## 2021-07-12 DIAGNOSIS — Z3040 Encounter for surveillance of contraceptives, unspecified: Secondary | ICD-10-CM | POA: Diagnosis not present

## 2021-08-16 DIAGNOSIS — Z113 Encounter for screening for infections with a predominantly sexual mode of transmission: Secondary | ICD-10-CM | POA: Diagnosis not present

## 2021-08-16 DIAGNOSIS — Z23 Encounter for immunization: Secondary | ICD-10-CM | POA: Diagnosis not present

## 2021-08-16 DIAGNOSIS — Z124 Encounter for screening for malignant neoplasm of cervix: Secondary | ICD-10-CM | POA: Diagnosis not present

## 2021-08-16 DIAGNOSIS — Z3040 Encounter for surveillance of contraceptives, unspecified: Secondary | ICD-10-CM | POA: Diagnosis not present

## 2021-08-16 DIAGNOSIS — Z8632 Personal history of gestational diabetes: Secondary | ICD-10-CM | POA: Diagnosis not present

## 2021-08-16 DIAGNOSIS — Z01419 Encounter for gynecological examination (general) (routine) without abnormal findings: Secondary | ICD-10-CM | POA: Diagnosis not present

## 2021-12-27 ENCOUNTER — Telehealth: Payer: Self-pay | Admitting: *Deleted

## 2021-12-27 NOTE — Patient Outreach (Signed)
Care Coordination ? ?12/27/2021 ? ?[name] ?1985/10/24 ?[id number] ? ? ?Medicaid Managed Care  ? ?Unsuccessful Outreach Note ? ?12/27/2021 ?Name: Danielle Shah MRN: [id number] DOB: [birth year] ? ?An unsuccessful telephone outreach to offer case management/care coordination services was attempted today.  ? ?Plan: A HIPPA compliant phone message was left for the patient providing contact information and requesting a return call.  ? ?Lurena Joiner RN, BSN ?[place] ?RN Care Coordinator ? ? ?

## 2021-12-28 ENCOUNTER — Telehealth: Payer: Self-pay | Admitting: *Deleted

## 2021-12-28 NOTE — Patient Outreach (Signed)
Care Coordination ? ?12/28/2021 ? ?Danielle Shah ?21-Sep-1986 ?[id number] ? ? ?Medicaid Managed Care  ? ?Unsuccessful Outreach Note ? ?12/28/2021 ?Name: NIL BOLSER MRN: [id number] DOB: 04-27-1986 ? ?A second unsuccessful telephone outreach to offer case management/care coordination services was attempted today.  ? ?Plan: A HIPPA compliant phone message was left for the patient providing contact information and requesting a return call.  ? ?Lurena Joiner RN, BSN ?[place] ?RN Care Coordinator ? ? ?

## 2022-05-17 ENCOUNTER — Other Ambulatory Visit (HOSPITAL_COMMUNITY)
Admission: RE | Admit: 2022-05-17 | Discharge: 2022-05-17 | Disposition: A | Discharge status: Home / Self Care | Payer: Medicaid Other | Source: Ambulatory Visit | Attending: Family Medicine | Admitting: Family Medicine

## 2022-05-17 ENCOUNTER — Ambulatory Visit (INDEPENDENT_AMBULATORY_CARE_PROVIDER_SITE_OTHER): Payer: Medicaid Other | Admitting: Family Medicine

## 2022-05-17 ENCOUNTER — Other Ambulatory Visit: Payer: Self-pay

## 2022-05-17 ENCOUNTER — Encounter: Payer: Self-pay | Admitting: Family Medicine

## 2022-05-17 VITALS — BP 134/88 | HR 82 | Resp 16 | Ht 63.0 in | Wt 167.0 lb

## 2022-05-17 DIAGNOSIS — M533 Sacrococcygeal disorders, not elsewhere classified: Secondary | ICD-10-CM | POA: Diagnosis not present

## 2022-05-17 DIAGNOSIS — N760 Acute vaginitis: Secondary | ICD-10-CM

## 2022-05-17 DIAGNOSIS — Z0000 Encounter for general adult medical examination without abnormal findings: Secondary | ICD-10-CM | POA: Diagnosis not present

## 2022-05-17 DIAGNOSIS — G8929 Other chronic pain: Secondary | ICD-10-CM | POA: Diagnosis not present

## 2022-05-17 DIAGNOSIS — E785 Hyperlipidemia, unspecified: Secondary | ICD-10-CM

## 2022-05-17 DIAGNOSIS — E559 Vitamin D deficiency, unspecified: Secondary | ICD-10-CM

## 2022-05-17 DIAGNOSIS — Z124 Encounter for screening for malignant neoplasm of cervix: Secondary | ICD-10-CM | POA: Diagnosis not present

## 2022-05-17 DIAGNOSIS — F900 Attention-deficit hyperactivity disorder, predominantly inattentive type: Secondary | ICD-10-CM

## 2022-05-17 DIAGNOSIS — E663 Overweight: Secondary | ICD-10-CM

## 2022-05-17 DIAGNOSIS — Z1159 Encounter for screening for other viral diseases: Secondary | ICD-10-CM | POA: Diagnosis not present

## 2022-05-17 MED ORDER — AMPHETAMINE-DEXTROAMPHET ER 20 MG PO CP24: 20.0000 mg | ORAL_CAPSULE | ORAL | 0 refills | Status: DC

## 2022-05-17 NOTE — Assessment & Plan Note (Signed)
Disabling left SI joint pain x 6 months, refer to Orthopedics asap

## 2022-05-17 NOTE — Assessment & Plan Note (Signed)
Wet prep and cultures sent

## 2022-05-17 NOTE — Assessment & Plan Note (Signed)
  Patient re-educated about  the importance of commitment to a  minimum of 150 minutes of exercise per week as able.  The importance of healthy food choices with portion control discussed, as well as eating regularly and within a 12 hour window most days. The need to choose "clean , green" food 50 to 75% of the time is discussed, as well as to make water the primary drink and set a goal of 64 ounces water daily.       05/17/2022    2:23 PM 06/02/2021    7:41 AM 10/19/2020    9:55 AM  Weight /BMI  Weight 167 lb 186 lb 163 lb  Height '5\' 3"'$  (1.6 m) '5\' 2"'$  (1.575 m) '5\' 2"'$  (1.575 m)  BMI 29.58 kg/m2 34.02 kg/m2 29.81 kg/m2

## 2022-05-17 NOTE — Patient Instructions (Addendum)
F/U in 12 weeks, re evaluate ADHD and  blood pressure, call if you need me sooner  Hep c screen, cmp and eGFR, tSH, Vit d , cBC, lipid today   You are referred urgently to Orthopedics  Adderall is prescribed you CANNOT breast feed when you take this medication  It is important that you exercise regularly at least 30 minutes 5 times a week. If you develop chest pain, have severe difficulty breathing, or feel very tired, stop exercising immediately and seek medical attention  Think about what you will eat, plan ahead. Choose " clean, green, fresh or frozen" over canned, processed or packaged foods which are more sugary, salty and fatty. 70 to 75% of food eaten should be vegetables and fruit. Three meals at set times with snacks allowed between meals, but they must be fruit or vegetables. Aim to eat over a 12 hour period , example 7 am to 7 pm, and STOP after  your last meal of the day. Drink water,generally about 64 ounces per day, no other drink is as healthy. Fruit juice is best enjoyed in a healthy way, by EATING the fruit. Thanks for choosing [facility], we consider it a privelige to serve you.

## 2022-05-17 NOTE — Assessment & Plan Note (Signed)
iun treated as has been breast feeding , resume medication and stop breastfeeding , infant is 2 weeks short of 37 year old

## 2022-05-17 NOTE — Progress Notes (Signed)
Danielle Shah     MRN: [id number]      DOB: Sep 16, 1986  HPI: Patient is in for annual physical exam. C/o painful right SI joint rated between 8 to 10,for approx 6 months rated between 8 to 10 aDHD needs are addressed Immunization is reviewed , and  is up to date Labs today   PE: BP 134/88   Pulse 82   Resp 16   Ht '5\' 3"'$  (1.6 m)   Wt 167 lb (75.8 kg)   LMP 05/08/2022 (Exact Date)   SpO2 97%   Breastfeeding Yes   BMI 29.58 kg/m   Pleasant  female, alert and oriented x 3, in no cardio-pulmonary distress. Afebrile. HEENT No facial trauma or asymetry. Sinuses non tender.  Extra occullar muscles intact.. External ears normal, . Neck: supple, no adenopathy,JVD or thyromegaly.No bruits.  Chest: Clear to ascultation bilaterally.No crackles or wheezes. Non tender to palpation  Breast: Not examined, tender, breast feeding  Cardiovascular system; Heart sounds normal,  S1 and  S2 ,no S3.  No murmur, or thrill. Apical beat not displaced Peripheral pulses normal.  Abdomen: Soft, non tender, no organomegaly or masses. No bruits. Bowel sounds normal. No guarding, tenderness or rebound.   GU: External genitalia normal female genitalia , normal female distribution of hair. No lesions. Urethral meatus normal in size, no  Prolapse, no lesions visibly  Present. Bladder non tender. Vagina pink and moist , with no visible lesions , discharge present . Adequate pelvic support no  cystocele or rectocele noted Cervix pink and appears healthy, no lesions or ulcerations noted, no discharge noted from os Uterus normal size, no adnexal masses, no cervical motion or adnexal tenderness.   Musculoskeletal exam: Decreased  ROM of lumbar spine,tender over left  SI joint, reduced in left   hip , adequate in shoulders and knees. No deformity ,swelling or crepitus noted. No muscle wasting or atrophy.   Neurologic: Cranial nerves 2 to 12 intact. Power, tone ,sensation and reflexes  normal throughout. No disturbance in gait. No tremor.  Skin: Intact, no ulceration, erythema , scaling or rash noted. Pigmentation normal throughout  Psych; Normal mood and affect. Judgement and concentration normal   Assessment & Plan:  Annual physical exam Annual exam as documented. Counseling done  re healthy lifestyle involving commitment to 150 minutes exercise per week, heart healthy diet, and attaining healthy weight.The importance of adequate sleep also discussed. . Changes in health habits are decided on by the patient with goals and time frames  set for achieving them. Immunization and cancer screening needs are specifically addressed at this visit.   ADHD (attention deficit hyperactivity disorder) iun treated as has been breast feeding , resume medication and stop breastfeeding , infant is 2 weeks short of 11 year old  Acute vaginitis Wet prep and cultures sent  Chronic left sacroiliac joint pain Disabling left SI joint pain x 6 months, refer to Orthopedics asap  Overweight (BMI 25.0-29.9)  Patient re-educated about  the importance of commitment to a  minimum of 150 minutes of exercise per week as able.  The importance of healthy food choices with portion control discussed, as well as eating regularly and within a 12 hour window most days. The need to choose "clean , green" food 50 to 75% of the time is discussed, as well as to make water the primary drink and set a goal of 64 ounces water daily.       05/17/2022    2:23  PM 06/02/2021    7:41 AM 10/19/2020    9:55 AM  Weight /BMI  Weight 167 lb 186 lb 163 lb  Height '5\' 3"'$  (1.6 m) '5\' 2"'$  (1.575 m) '5\' 2"'$  (1.575 m)  BMI 29.58 kg/m2 34.02 kg/m2 29.81 kg/m2

## 2022-05-17 NOTE — Assessment & Plan Note (Signed)
Annual exam as documented. Counseling done  re healthy lifestyle involving commitment to 150 minutes exercise per week, heart healthy diet, and attaining healthy weight.The importance of adequate sleep also discussed. Changes in health habits are decided on by the patient with goals and time frames  set for achieving them. Immunization and cancer screening needs are specifically addressed at this visit. 

## 2022-05-18 ENCOUNTER — Ambulatory Visit (INDEPENDENT_AMBULATORY_CARE_PROVIDER_SITE_OTHER): Payer: Medicaid Other | Admitting: Surgery

## 2022-05-18 ENCOUNTER — Ambulatory Visit: Payer: Self-pay

## 2022-05-18 ENCOUNTER — Encounter: Payer: Self-pay | Admitting: Surgery

## 2022-05-18 DIAGNOSIS — M533 Sacrococcygeal disorders, not elsewhere classified: Secondary | ICD-10-CM

## 2022-05-18 DIAGNOSIS — G8929 Other chronic pain: Secondary | ICD-10-CM | POA: Diagnosis not present

## 2022-05-18 LAB — CBC
Hematocrit: 36.3 % (ref 34.0–46.6)
Hemoglobin: 12.4 g/dL (ref 11.1–15.9)
MCH: 32.5 pg (ref 26.6–33.0)
MCHC: 34.2 g/dL (ref 31.5–35.7)
MCV: 95 fL (ref 79–97)
Platelets: 345 10*3/uL (ref 150–450)
RBC: 3.82 x10E6/uL (ref 3.77–5.28)
RDW: 12.2 % (ref 11.7–15.4)
WBC: 8.9 10*3/uL (ref 3.4–10.8)

## 2022-05-18 LAB — CMP14+EGFR
ALT: 14 IU/L (ref 0–32)
AST: 14 IU/L (ref 0–40)
Albumin/Globulin Ratio: 1.8 (ref 1.2–2.2)
Albumin: 4.6 g/dL (ref 3.9–4.9)
Alkaline Phosphatase: 128 IU/L — ABNORMAL HIGH (ref 44–121)
BUN/Creatinine Ratio: 17 (ref 9–23)
BUN: 10 mg/dL (ref 6–20)
Bilirubin Total: <0.2 mg/dL (ref 0.0–1.2)
CO2: 21 mmol/L (ref 20–29)
Calcium: 9.8 mg/dL (ref 8.7–10.2)
Chloride: 106 mmol/L (ref 96–106)
Creatinine, Ser: 0.6 mg/dL (ref 0.57–1.00)
Globulin, Total: 2.6 g/dL (ref 1.5–4.5)
Glucose: 84 mg/dL (ref 70–99)
Potassium: 4.3 mmol/L (ref 3.5–5.2)
Sodium: 141 mmol/L (ref 134–144)
Total Protein: 7.2 g/dL (ref 6.0–8.5)
eGFR: 119 mL/min/{1.73_m2} (ref >59)

## 2022-05-18 LAB — LIPID PANEL
Chol/HDL Ratio: 4.1 ratio (ref 0.0–4.4)
Cholesterol, Total: 186 mg/dL (ref 100–199)
HDL: 45 mg/dL (ref >39)
LDL Chol Calc (NIH): 111 mg/dL — ABNORMAL HIGH (ref 0–99)
Triglycerides: 168 mg/dL — ABNORMAL HIGH (ref 0–149)
VLDL Cholesterol Cal: 30 mg/dL (ref 5–40)

## 2022-05-18 LAB — TSH: TSH: 0.662 u[IU]/mL (ref 0.450–4.500)

## 2022-05-18 LAB — HEPATITIS C ANTIBODY: Hep C Virus Ab: NONREACTIVE

## 2022-05-18 LAB — VITAMIN D 25 HYDROXY (VIT D DEFICIENCY, FRACTURES): Vit D, 25-Hydroxy: 23.1 ng/mL — ABNORMAL LOW (ref 30.0–100.0)

## 2022-05-18 MED ORDER — METHYLPREDNISOLONE 4 MG PO TABS: ORAL_TABLET | ORAL | 0 refills | Status: DC

## 2022-05-18 MED ORDER — METHYLPREDNISOLONE ACETATE 80 MG/ML IJ SUSP: 80.0000 mg | Freq: Once | INTRAMUSCULAR | Status: DC

## 2022-05-18 NOTE — Progress Notes (Addendum)
Office Visit Note   Patient: Danielle Shah           Date of Birth: October 18, 1985           MRN: [id number] Visit Date: 05/18/2022              Requested by: Fayrene Helper, [place], [place],  [place] [address] PCP: Fayrene Helper, MD   Assessment & Plan: Visit Diagnoses:  1. Chronic left SI joint pain     Plan: With patient's chronic pain over the left SI joint I will schedule her for diagnostic/therapeutic left SI joint injection.  I had initially wanted to prescribe her a prednisone taper but with her breast-feeding I do not feel comfortable doing so.  Advised her that she could get in contact with her OB or pediatrician to see if a prednisone taper would be okay.  Would also like her to check with one of her other physicians to make sure that it is okay for her to have the SI joint injection as well.  Follow-up in a couple weeks for recheck.  Blood work was drawn today to check a CBC and arthritis panel.  Follow-Up Instructions: Return in about 2 weeks (around 06/01/2022) for with [facility] recheck .   Orders:  Orders Placed This Encounter  Procedures   XR Pelvis 1-2 Views   Antinuclear Antib (ANA)   Sed Rate (ESR)   Uric acid   Rheumatoid Factor   Ambulatory referral to Physical Medicine Rehab   Meds ordered this encounter  Medications   methylPREDNISolone (MEDROL) 4 MG tablet    Sig: 6 day taper to be taken as directed.    Dispense:  21 tablet    Refill:  0   DISCONTD: methylPREDNISolone acetate (DEPO-MEDROL) injection 80 mg      Procedures: No procedures performed   Clinical Data: No additional findings.   Subjective: No chief complaint on file.   HPI 36 year old female who is new patient clinic is referred to our office for chronic left SI joint pain.  Patient states that since giving birth to her child August 2022 she has been having ongoing pain in the left SI joint.  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  Denies lower extremity radicular symptoms.  Left SI joint pain aggravated when she is ambulating, sitting, bending.  She was seen by her primary care physician Dr. Tula Nakayama May 17, 2022 and patient was referred to our office due to the disabling pain. Review of Systems No current complaints of cardiopulmonary GI/GU issues  Objective: Vital Signs: LMP 05/08/2022 (Exact Date)   Physical Exam HENT:     Head: Normocephalic and atraumatic.     Nose: Nose normal.  Eyes:     Extraocular Movements: Extraocular movements intact.  Pulmonary:     Effort: Pulmonary effort is normal. No respiratory distress.  Musculoskeletal:     Comments: Gait is somewhat antalgic.  No lumbar paraspinal tenderness.  She has marked tenderness over the left SI joint.  Nontender over the right side.  Negative logroll bilateral hips.  Negative straight leg raise.  Positive left FABER test.  No focal motor deficits.  Neurological:     Mental Status: She is alert.  Psychiatric:        Mood and Affect: Mood normal.     Ortho Exam  Specialty Comments:  No  specialty comments available.  Imaging: No results found.   PMFS History: Patient Active Problem List   Diagnosis Date Noted   Annual physical exam 05/17/2022   Acute vaginitis 05/17/2022   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   Normal labor 06/02/2021   SVD (spontaneous vaginal delivery) 06/02/2021   Normal postpartum course 06/02/2021   ADHD (attention deficit hyperactivity disorder) 01/31/2018   Anxiety and depression 12/14/2017   Depression, major, single episode, severe ([place]) 12/14/2017   Anxiety 12/14/2017   Hyperlipidemia, mild 11/29/2013   Overweight (BMI 25.0-29.9) 11/11/2009   Herpes simplex virus (HSV) infection 10/09/2008   Past Medical History:  Diagnosis Date   Anemia    Anxiety    Depression    Fibroid    Obesity (BMI 30.0-34.9)     Family History   Problem Relation Age of Onset   Heart disease Mother        CHF   Arthritis Mother    Hyperlipidemia Mother    Hypertension Mother     Past Surgical History:  Procedure Laterality Date   EYE SURGERY Right 2003   WISDOM TOOTH EXTRACTION  2004   Social History   Occupational History   Not on file  Tobacco Use   Smoking status: Former    Types: Cigarettes   Smokeless tobacco: Never  Vaping Use   Vaping Use: Never used  Substance and Sexual Activity   Alcohol use: Not Currently    Comment: social drinking   Drug use: Not Currently    Types: Marijuana    Comment: last use thanksgiving   Sexual activity: Yes    Birth control/protection: Other-see comments    Comment: undecided

## 2022-05-19 LAB — RHEUMATOID FACTOR: Rheumatoid fact SerPl-aCnc: <14 IU/mL (ref <14)

## 2022-05-19 LAB — URIC ACID: Uric Acid, Serum: 5.1 mg/dL (ref 2.5–7.0)

## 2022-05-19 LAB — SEDIMENTATION RATE

## 2022-05-19 LAB — ANA: Anti Nuclear Antibody (ANA): NEGATIVE

## 2022-05-21 LAB — CERVICOVAGINAL ANCILLARY ONLY
Bacterial Vaginitis (gardnerella): POSITIVE — AB
Candida Glabrata: NEGATIVE
Candida Vaginitis: NEGATIVE
Chlamydia: NEGATIVE
Comment: NEGATIVE
Comment: NEGATIVE
Comment: NEGATIVE
Comment: NEGATIVE
Comment: NEGATIVE
Comment: NORMAL
Neisseria Gonorrhea: NEGATIVE
Trichomonas: NEGATIVE

## 2022-05-22 ENCOUNTER — Telehealth: Payer: Self-pay

## 2022-05-22 LAB — CYTOLOGY - PAP
Comment: NEGATIVE
Diagnosis: NEGATIVE
High risk HPV: NEGATIVE

## 2022-05-22 NOTE — Telephone Encounter (Signed)
FYI  Pt's primary care doctor is sending over clearance letter for pt to have an injection.

## 2022-05-23 ENCOUNTER — Encounter: Payer: Self-pay | Admitting: Family Medicine

## 2022-05-24 ENCOUNTER — Other Ambulatory Visit: Payer: Self-pay | Admitting: Family Medicine

## 2022-05-24 MED ORDER — ETONOGESTREL-ETHINYL ESTRADIOL 0.12-0.015 MG/24HR VA RING: VAGINAL_RING | VAGINAL | 3 refills | Status: DC

## 2022-06-01 ENCOUNTER — Ambulatory Visit: Payer: Medicaid Other | Admitting: Surgery

## 2022-06-01 ENCOUNTER — Telehealth: Payer: Self-pay | Admitting: Radiology

## 2022-06-01 ENCOUNTER — Other Ambulatory Visit: Payer: Self-pay | Admitting: Surgery

## 2022-06-01 DIAGNOSIS — R102 Pelvic and perineal pain: Secondary | ICD-10-CM

## 2022-06-01 NOTE — Telephone Encounter (Signed)
I called patient and left voicemail requesting return call to me. Voicemail did not state identification so no pertinent information left. Patient should be getting call from [place] to schedule SI joint injection next week. [name] does not need to see her today as that will be the plan and they have already discussed the injection. She will need a follow up appointment for two weeks post injection with Dr. Lorin Mercy if she has continued problems.

## 2022-06-07 ENCOUNTER — Ambulatory Visit: Payer: Self-pay

## 2022-06-07 ENCOUNTER — Ambulatory Visit (INDEPENDENT_AMBULATORY_CARE_PROVIDER_SITE_OTHER): Payer: Medicaid Other | Admitting: Physical Medicine and Rehabilitation

## 2022-06-07 ENCOUNTER — Encounter: Payer: Self-pay | Admitting: Physical Medicine and Rehabilitation

## 2022-06-07 VITALS — BP 129/93 | HR 99

## 2022-06-07 DIAGNOSIS — M461 Sacroiliitis, not elsewhere classified: Secondary | ICD-10-CM | POA: Diagnosis not present

## 2022-06-07 NOTE — Progress Notes (Signed)
Pt state lower back pain that travels down her left leg. Pt state sitting or trying to get out the car. Pt state she takes over the counter pain meds and uses heat to help ease her pain.  Numeric Pain Rating Scale and Functional Assessment Average Pain 8   In the last MONTH (on 0-10 scale) has pain interfered with the following?  1. General activity like being  able to carry out your everyday physical activities such as walking, climbing stairs, carrying groceries, or moving a chair?  Rating(10)   -Driver, -BT, -Dye Allergies.

## 2022-06-07 NOTE — Progress Notes (Signed)
Danielle Shah - 36 y.o. female MRN [id number]  Date of birth: Dec 26, 1985  Office Visit Note: Visit Date: 06/07/2022 PCP: Fayrene Helper, MD Referred by: Fayrene Helper, MD  Subjective: Chief Complaint  Patient presents with   Lower Back - Pain   Left Leg - Pain   HPI:  Danielle Shah is a 36 y.o. female who comes in today at the request of Benjiman Core, PA-C for planned Left anesthetic Sacroiliac joint arthrogram with fluoroscopic guidance.  The patient has failed conservative care including home exercise, medications, time and activity modification.  This injection will be diagnostic and hopefully therapeutic.  Please see requesting physician notes for further details and justification.   Positive Fortin finger sign, Patrick's testing, Gaenslen's  and lateral compression test.     ROS Otherwise per HPI.  Assessment & Plan: Visit Diagnoses:    ICD-10-CM   1. Sacroiliitis (HCC)  M46.1 Left Sacroiliac Joint Inj    XR C-ARM NO REPORT      Plan: No additional findings.   Meds & Orders: No orders of the defined types were placed in this encounter.   Orders Placed This Encounter  Procedures   Left Sacroiliac Joint Inj   XR C-ARM NO REPORT    Follow-up: No follow-ups on file.   Procedures: Left Sacroiliac Joint Inj on 06/07/2022 8:38 AM Indications: pain and diagnostic evaluation Details: 22 G 3.5 in needle, fluoroscopy-guided posterior approach Medications: 2 mL bupivacaine 0.5 %; 40 mg methylPREDNISolone acetate 80 MG/ML Outcome: tolerated well, no immediate complications  Sacroiliac Joint Intra-Articular Injection - Posterior Approach with Fluoroscopic Guidance   Position: PRONE  Additional Comments: Vital signs were monitored before and after the procedure. Patient was prepped and draped in the usual sterile fashion. The correct patient, procedure, and site was verified.   Injection Procedure Details:   Location/Site:  Sacroiliac joint  Needle size:  3.5 in Spinal Needle  Needle type: Spinal  Needle Placement: Intra-articular  Findings:  -Comments: There was excellent flow of contrast producing a partial arthrogram of the sacroiliac joint.   Procedure Details: Starting with a 90 degree vertical and midline orientation the fluoroscope was tilted cranially 20 to 25 degrees and the target area of the inferior most part of the SI joint on the side mentioned above was visualized.  The soft tissues overlying this target were infiltrated with 4 ml. of 1% Lidocaine without Epinephrine. A #22 gauge spinal needle was inserted perpendicular to the fluoroscope table and advanced into the posterior inferior joint space using fluoroscopic guidance.  Position in the joint space was confirmed by obtaining a partial arthrogram using a 2 ml. volume of Isovue-250 contrast agent. After negative aspirate for gross pus or blood, the injectate was delivered to the joint. Radiographs were obtained for documentation purposes.   Additional Comments:   Dressing: Bandaid    Post-procedure details: Patient was observed during the procedure. Post-procedure instructions were reviewed.  Patient left the clinic in stable condition.    There was excellent flow of contrast producing a partial arthrogram of the sacroiliac joint.  Procedure, treatment alternatives, risks and benefits explained, specific risks discussed. Consent was given by the patient. Immediately prior to procedure a time out was called to verify the correct patient, procedure, equipment, support staff and site/side marked as required. Patient was prepped and draped in the usual sterile fashion.          Clinical History: No specialty comments available.     Objective:  VS:  HT:    WT:   BMI:     BP:(!) 129/93  HR:99bpm  TEMP: ( )  RESP:  Physical Exam   Imaging: No results found.

## 2022-06-07 NOTE — Patient Instructions (Signed)

## 2022-06-13 MED ORDER — METHYLPREDNISOLONE ACETATE 80 MG/ML IJ SUSP
40.0000 mg | INTRAMUSCULAR | Status: AC | PRN
  Administered 2022-06-07: 40 mg via INTRA_ARTICULAR

## 2022-06-13 MED ORDER — BUPIVACAINE HCL 0.5 % IJ SOLN
2.0000 mL | INTRAMUSCULAR | Status: AC | PRN
  Administered 2022-06-07: 2 mL via INTRA_ARTICULAR

## 2022-06-21 ENCOUNTER — Encounter: Payer: Self-pay | Admitting: Surgery

## 2022-06-21 ENCOUNTER — Ambulatory Visit: Payer: Self-pay

## 2022-06-21 ENCOUNTER — Ambulatory Visit (INDEPENDENT_AMBULATORY_CARE_PROVIDER_SITE_OTHER): Payer: Medicaid Other | Admitting: Surgery

## 2022-06-21 VITALS — BP 140/96 | HR 96 | Ht 63.0 in | Wt 167.0 lb

## 2022-06-21 DIAGNOSIS — M25511 Pain in right shoulder: Secondary | ICD-10-CM | POA: Diagnosis not present

## 2022-06-21 DIAGNOSIS — S46911A Strain of unspecified muscle, fascia and tendon at shoulder and upper arm level, right arm, initial encounter: Secondary | ICD-10-CM | POA: Diagnosis not present

## 2022-06-21 DIAGNOSIS — M545 Low back pain, unspecified: Secondary | ICD-10-CM | POA: Diagnosis not present

## 2022-06-21 DIAGNOSIS — S39012A Strain of muscle, fascia and tendon of lower back, initial encounter: Secondary | ICD-10-CM | POA: Diagnosis not present
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
## 2022-06-28 ENCOUNTER — Ambulatory Visit (INDEPENDENT_AMBULATORY_CARE_PROVIDER_SITE_OTHER): Payer: Medicaid Other | Admitting: Surgery

## 2022-06-28 ENCOUNTER — Encounter: Payer: Self-pay | Admitting: Surgery

## 2022-06-28 VITALS — BP 125/88 | HR 99 | Wt 167.0 lb

## 2022-06-28 DIAGNOSIS — G8929 Other chronic pain: Secondary | ICD-10-CM

## 2022-06-28 DIAGNOSIS — M545 Low back pain, unspecified: Secondary | ICD-10-CM | POA: Diagnosis not present

## 2022-06-28 DIAGNOSIS — M25511 Pain in right shoulder: Secondary | ICD-10-CM

## 2022-06-28 DIAGNOSIS — M533 Sacrococcygeal disorders, not elsewhere classified: Secondary | ICD-10-CM
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
## 2022-06-29 ENCOUNTER — Ambulatory Visit: Payer: Medicaid Other | Admitting: Rehabilitative and Restorative Service Providers"
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
## 2022-07-10 ENCOUNTER — Encounter: Payer: Self-pay | Admitting: Physical Therapy

## 2022-07-10 ENCOUNTER — Other Ambulatory Visit: Payer: Self-pay

## 2022-07-10 ENCOUNTER — Ambulatory Visit: Payer: Medicaid Other | Attending: Surgery | Admitting: Physical Therapy

## 2022-07-10 DIAGNOSIS — G8929 Other chronic pain: Secondary | ICD-10-CM | POA: Insufficient documentation

## 2022-07-10 DIAGNOSIS — M533 Sacrococcygeal disorders, not elsewhere classified: Secondary | ICD-10-CM | POA: Diagnosis not present

## 2022-07-10 DIAGNOSIS — M6281 Muscle weakness (generalized): Secondary | ICD-10-CM | POA: Diagnosis present

## 2022-07-10 DIAGNOSIS — M25511 Pain in right shoulder: Secondary | ICD-10-CM | POA: Insufficient documentation

## 2022-07-10 DIAGNOSIS — M545 Low back pain, unspecified: Secondary | ICD-10-CM | POA: Diagnosis present

## 2022-07-13 ENCOUNTER — Telehealth: Payer: Self-pay | Admitting: Surgery

## 2022-07-13 NOTE — Telephone Encounter (Signed)
Patient came in to pay $25 cash for [facility] paperwork

## 2022-07-18 ENCOUNTER — Ambulatory Visit: Payer: Medicaid Other

## 2022-07-18 DIAGNOSIS — M25511 Pain in right shoulder: Secondary | ICD-10-CM

## 2022-07-18 DIAGNOSIS — M6281 Muscle weakness (generalized): Secondary | ICD-10-CM

## 2022-07-18 DIAGNOSIS — M545 Low back pain, unspecified: Secondary | ICD-10-CM

## 2022-07-18 DIAGNOSIS — G8929 Other chronic pain: Secondary | ICD-10-CM | POA: Diagnosis not present
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
## 2022-07-18 NOTE — Patient Instructions (Signed)

## 2022-07-19 ENCOUNTER — Ambulatory Visit (INDEPENDENT_AMBULATORY_CARE_PROVIDER_SITE_OTHER): Payer: Medicaid Other | Admitting: Surgery

## 2022-07-19 ENCOUNTER — Encounter: Payer: Self-pay | Admitting: Surgery

## 2022-07-19 VITALS — BP 128/89 | HR 80 | Ht 63.0 in | Wt 167.0 lb

## 2022-07-19 DIAGNOSIS — M25511 Pain in right shoulder: Secondary | ICD-10-CM | POA: Diagnosis not present

## 2022-07-19 DIAGNOSIS — M533 Sacrococcygeal disorders, not elsewhere classified: Secondary | ICD-10-CM

## 2022-07-19 DIAGNOSIS — M7541 Impingement syndrome of right shoulder: Secondary | ICD-10-CM

## 2022-07-19 DIAGNOSIS — G8929 Other chronic pain: Secondary | ICD-10-CM | POA: Diagnosis not present

## 2022-07-19 MED ORDER — BUPIVACAINE HCL 0.5 % IJ SOLN
5.0000 mL | INTRAMUSCULAR | Status: AC | PRN
  Administered 2022-07-19: 5 mL via INTRA_ARTICULAR

## 2022-07-19 MED ORDER — METHYLPREDNISOLONE ACETATE 40 MG/ML IJ SUSP
40.0000 mg | INTRAMUSCULAR | Status: AC | PRN
  Administered 2022-07-19: 40 mg via INTRA_ARTICULAR

## 2022-07-19 MED ORDER — LIDOCAINE HCL 1 % IJ SOLN
3.0000 mL | INTRAMUSCULAR | Status: AC | PRN
  Administered 2022-07-19: 3 mL
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
## 2022-07-20 ENCOUNTER — Ambulatory Visit: Payer: Medicaid Other

## 2022-07-20 DIAGNOSIS — M545 Low back pain, unspecified: Secondary | ICD-10-CM

## 2022-07-20 DIAGNOSIS — G8929 Other chronic pain: Secondary | ICD-10-CM | POA: Diagnosis not present

## 2022-07-20 DIAGNOSIS — M6281 Muscle weakness (generalized): Secondary | ICD-10-CM

## 2022-07-20 DIAGNOSIS — M25511 Pain in right shoulder: Secondary | ICD-10-CM
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
## 2022-07-25 ENCOUNTER — Ambulatory Visit: Payer: Medicaid Other

## 2022-07-25 DIAGNOSIS — M25511 Pain in right shoulder: Secondary | ICD-10-CM

## 2022-07-25 DIAGNOSIS — M6281 Muscle weakness (generalized): Secondary | ICD-10-CM

## 2022-07-25 DIAGNOSIS — G8929 Other chronic pain: Secondary | ICD-10-CM | POA: Diagnosis not present

## 2022-07-25 DIAGNOSIS — M545 Low back pain, unspecified: Secondary | ICD-10-CM
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
## 2022-07-27 ENCOUNTER — Ambulatory Visit: Payer: Medicaid Other

## 2022-07-27 DIAGNOSIS — G8929 Other chronic pain: Secondary | ICD-10-CM | POA: Diagnosis not present

## 2022-07-27 DIAGNOSIS — M545 Low back pain, unspecified: Secondary | ICD-10-CM

## 2022-07-27 DIAGNOSIS — M25511 Pain in right shoulder: Secondary | ICD-10-CM

## 2022-07-27 DIAGNOSIS — M6281 Muscle weakness (generalized): Secondary | ICD-10-CM
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
## 2022-07-30 ENCOUNTER — Ambulatory Visit (INDEPENDENT_AMBULATORY_CARE_PROVIDER_SITE_OTHER): Payer: Medicaid Other | Admitting: Sports Medicine

## 2022-07-30 ENCOUNTER — Encounter: Payer: Self-pay | Admitting: Sports Medicine

## 2022-07-30 ENCOUNTER — Ambulatory Visit: Payer: Self-pay

## 2022-07-30 VITALS — BP 123/82 | HR 101

## 2022-07-30 DIAGNOSIS — M533 Sacrococcygeal disorders, not elsewhere classified: Secondary | ICD-10-CM | POA: Diagnosis not present

## 2022-07-30 DIAGNOSIS — G8929 Other chronic pain: Secondary | ICD-10-CM | POA: Diagnosis not present

## 2022-07-30 NOTE — Progress Notes (Signed)
   Procedure Note  Patient: Danielle Shah             Date of Birth: 09-05-1986           MRN: [id number]             Visit Date: 07/30/2022  Procedures: Visit Diagnoses:  1. Chronic left SI joint pain    U/S-guided SI-joint injection, Left   After discussion of risk/benefits/indications, informed verbal consent was obtained. A timeout was then performed. The patient was positioned in a prone position on exam room table with a pillow placed under the pelvis for mild hip flexion. The SI joint area was cleaned and prepped with betadine and alcohol swabs. Sterile ultrasound gel was applied and the ultrasound transducer was placed in an anatomic axial plane over the PSIS, then moved distally over the SI-joint. Using ultrasound guidance, a 22-gauge, 3.5" needle was inserted from a medial to lateral approach utilizing an in-plane approach and directed into the SI-joint. The SI-joint was then injected with a mixture of 4:2 lidocaine:depomedrol with visualization of the injectate flow into the SI-joint under ultrasound visualization. The patient tolerated the procedure well without immediate complications.  - I evaluated the patient about 10 minutes post-injection and she had definite improvement in pain and range of motion - follow-up with Benjiman Core and Dr. Lorin Mercy as indicated; I am happy to see them as needed  Elba Barman, DO [place]  This note was dictated using Dragon naturally speaking software and may contain errors in syntax, spelling, or content which have not been identified prior to signing this note.

## 2022-08-01 ENCOUNTER — Encounter: Payer: Self-pay | Admitting: Surgery

## 2022-08-01 ENCOUNTER — Ambulatory Visit: Payer: Medicaid Other

## 2022-08-01 ENCOUNTER — Ambulatory Visit (INDEPENDENT_AMBULATORY_CARE_PROVIDER_SITE_OTHER): Payer: Medicaid Other | Admitting: Surgery

## 2022-08-01 DIAGNOSIS — M533 Sacrococcygeal disorders, not elsewhere classified: Secondary | ICD-10-CM

## 2022-08-01 DIAGNOSIS — G8929 Other chronic pain: Secondary | ICD-10-CM | POA: Diagnosis not present

## 2022-08-01 DIAGNOSIS — M25511 Pain in right shoulder: Secondary | ICD-10-CM

## 2022-08-01 DIAGNOSIS — M545 Low back pain, unspecified: Secondary | ICD-10-CM

## 2022-08-01 DIAGNOSIS — M6281 Muscle weakness (generalized): Secondary | ICD-10-CM
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
## 2022-08-03 ENCOUNTER — Ambulatory Visit: Payer: Medicaid Other | Admitting: Physical Therapy

## 2022-08-09 ENCOUNTER — Ambulatory Visit (INDEPENDENT_AMBULATORY_CARE_PROVIDER_SITE_OTHER): Payer: Medicaid Other | Admitting: Family Medicine

## 2022-08-09 ENCOUNTER — Encounter: Payer: Self-pay | Admitting: Family Medicine

## 2022-08-09 VITALS — BP 124/84 | HR 104 | Ht 63.0 in | Wt 161.0 lb

## 2022-08-09 DIAGNOSIS — N760 Acute vaginitis: Secondary | ICD-10-CM | POA: Diagnosis not present

## 2022-08-09 DIAGNOSIS — F900 Attention-deficit hyperactivity disorder, predominantly inattentive type: Secondary | ICD-10-CM

## 2022-08-09 DIAGNOSIS — G8929 Other chronic pain: Secondary | ICD-10-CM | POA: Diagnosis not present

## 2022-08-09 DIAGNOSIS — M533 Sacrococcygeal disorders, not elsewhere classified: Secondary | ICD-10-CM

## 2022-08-09 DIAGNOSIS — Z23 Encounter for immunization: Secondary | ICD-10-CM

## 2022-08-09 DIAGNOSIS — M25511 Pain in right shoulder: Secondary | ICD-10-CM | POA: Insufficient documentation

## 2022-08-09 MED ORDER — METRONIDAZOLE 500 MG PO TABS: 500.0000 mg | ORAL_TABLET | Freq: Two times a day (BID) | ORAL | 0 refills | Status: DC

## 2022-08-09 MED ORDER — AMPHETAMINE-DEXTROAMPHET ER 20 MG PO CP24: 20.0000 mg | ORAL_CAPSULE | ORAL | 0 refills | Status: DC

## 2022-08-09 NOTE — Assessment & Plan Note (Signed)
Uncontrolled, has not been taking medication just recently started, will stay on current dose till in office re eval, 3 months prescribed
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
## 2022-08-09 NOTE — Patient Instructions (Signed)
F/u in 13 weeks, call if you need me sooner  Flu vaccine today  Take prednisone 4 mg tabs 1 three times daily for 2 days, then twice daily for 2 days , then one daily for 2 days, then stop  Metronidazole sent for infection  Stay on 20 mg adderall until next in office eval please  It is important that you exercise regularly at least 30 minutes 5 times a week. If you develop chest pain, have severe difficulty breathing, or feel very tired, stop exercising immediately and seek medical attention    Thanks for choosing  [facility], we consider it a privelige to serve you.

## 2022-08-09 NOTE — Assessment & Plan Note (Signed)
Symptomatic,  oral flagyll prescribed
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
## 2022-08-09 NOTE — Assessment & Plan Note (Signed)
Improved with SI joint injection

## 2022-08-29 ENCOUNTER — Encounter: Payer: Self-pay | Admitting: Orthopaedic Surgery

## 2022-08-29 ENCOUNTER — Ambulatory Visit (INDEPENDENT_AMBULATORY_CARE_PROVIDER_SITE_OTHER): Payer: Medicaid Other | Admitting: Orthopaedic Surgery

## 2022-08-29 VITALS — BP 139/94 | HR 111 | Ht 63.0 in | Wt 156.0 lb

## 2022-08-29 DIAGNOSIS — M25511 Pain in right shoulder: Secondary | ICD-10-CM
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
## 2022-09-19 ENCOUNTER — Ambulatory Visit (INDEPENDENT_AMBULATORY_CARE_PROVIDER_SITE_OTHER): Payer: Medicaid Other | Admitting: Podiatry

## 2022-09-19 DIAGNOSIS — L600 Ingrowing nail: Secondary | ICD-10-CM

## 2022-09-19 NOTE — Patient Instructions (Signed)

## 2022-09-19 NOTE — Progress Notes (Signed)
Subjective:   Patient ID: Danielle Shah, female   DOB: 36 y.o.   MRN: [id number]   HPI Patient presents with painful ingrown toenail deformity left big toe second toe mild on the right and states that if it gets bumped or anything happens if she wear shoes it is very sore and she is tried to trim and soak it without relief and has been present for a while.  Patient does not smoke likes to be active   Review of Systems  All other systems reviewed and are negative.       Objective:  Physical Exam Vitals and nursing note reviewed.  Constitutional:      Appearance: She is well-developed.  Pulmonary:     Effort: Pulmonary effort is normal.  Musculoskeletal:        General: Normal range of motion.  Skin:    General: Skin is warm.  Neurological:     Mental Status: She is alert.     Neurovascular status intact muscle strength found to be adequate range of motion within normal limits with incurvated medial border left big toe second toe that are painful when pressed making shoe gear difficult.  Right slightly ingrown nowhere near to the same degree with good digital perfusion well-oriented x 3     Assessment:  Ingrown toenail deformity left hallux second toe that are painful when pressed mild deformity right both in the medial border     Plan:  H&P discussed condition recommended correction of deformity explained procedure risk patient wants surgery understanding risk and signed consent form.  Today I infiltrated each hallux 60 mg like Marcaine mixture sterile prep done using sterile instrumentation removed the medial border of the left hallux and second nailbeds exposed matrix applied phenol 3 applications 30 seconds followed by alcohol by sterile dressing gave instructions on soaks and to wear dressings 24 hours but take them off earlier if throbbing were to occur.  Encouraged her to call questions concerns

## 2022-09-20 ENCOUNTER — Ambulatory Visit
Admission: RE | Admit: 2022-09-20 | Discharge: 2022-09-20 | Disposition: A | Discharge status: Home / Self Care | Payer: Medicaid Other | Source: Ambulatory Visit | Attending: Orthopaedic Surgery | Admitting: Orthopaedic Surgery

## 2022-09-20 ENCOUNTER — Telehealth: Payer: Self-pay | Admitting: Orthopaedic Surgery

## 2022-09-20 DIAGNOSIS — M25511 Pain in right shoulder: Secondary | ICD-10-CM

## 2022-09-20 DIAGNOSIS — M7551 Bursitis of right shoulder: Secondary | ICD-10-CM | POA: Diagnosis not present

## 2022-09-20 DIAGNOSIS — M19011 Primary osteoarthritis, right shoulder: Secondary | ICD-10-CM | POA: Diagnosis not present

## 2022-09-20 NOTE — Telephone Encounter (Signed)
Patient would like to know if her MRI results could be read over the phone? Says she can not come in for a MRI review.

## 2022-09-21 ENCOUNTER — Telehealth: Payer: Self-pay | Admitting: Orthopaedic Surgery

## 2022-09-21 NOTE — Telephone Encounter (Signed)
Pt called requesting a call from Dr Lorin Mercy to go over MRI results. Pt states she think she got a call from Dr Lorin Mercy. Pt phone number is [phone].

## 2022-10-10 ENCOUNTER — Other Ambulatory Visit: Payer: Self-pay | Admitting: Family Medicine

## 2022-10-11 MED ORDER — AMPHETAMINE-DEXTROAMPHET ER 20 MG PO CP24: 20.0000 mg | ORAL_CAPSULE | ORAL | 0 refills | Status: DC

## 2022-10-23 ENCOUNTER — Ambulatory Visit: Payer: Medicaid Other | Admitting: Orthopaedic Surgery

## 2022-10-23 DIAGNOSIS — M7551 Bursitis of right shoulder: Secondary | ICD-10-CM | POA: Diagnosis not present

## 2022-10-23 MED ORDER — LIDOCAINE HCL 1 % IJ SOLN
0.5000 mL | INTRAMUSCULAR | Status: AC | PRN
  Administered 2022-10-23: .5 mL

## 2022-10-23 MED ORDER — BUPIVACAINE HCL 0.25 % IJ SOLN
4.0000 mL | INTRAMUSCULAR | Status: AC | PRN
  Administered 2022-10-23: 4 mL via INTRA_ARTICULAR

## 2022-10-23 MED ORDER — METHYLPREDNISOLONE ACETATE 40 MG/ML IJ SUSP
40.0000 mg | INTRAMUSCULAR | Status: AC | PRN
  Administered 2022-10-23: 40 mg via INTRA_ARTICULAR
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
## 2022-10-24 ENCOUNTER — Encounter: Payer: Self-pay | Admitting: Podiatry

## 2022-10-29 ENCOUNTER — Encounter: Payer: Self-pay | Admitting: Podiatry

## 2022-10-29 ENCOUNTER — Ambulatory Visit (INDEPENDENT_AMBULATORY_CARE_PROVIDER_SITE_OTHER): Payer: Medicaid Other | Admitting: Podiatry

## 2022-10-29 VITALS — BP 100/65 | HR 80

## 2022-10-29 DIAGNOSIS — L03032 Cellulitis of left toe: Secondary | ICD-10-CM

## 2022-10-29 NOTE — Progress Notes (Signed)
Subjective:   Patient ID: Danielle Shah, female   DOB: 37 y.o.   MRN: [id number]   HPI Patient states the second toe has started in the last week to become a little tender and crusted and I was worried about localized infection.  It only bothers me a little but I am concerned   ROS      Objective:  Physical Exam  Neurovascular status intact crusted tissue medial side digit to left localized no proximal edema erythema or drainage noted currently     Assessment:  Probability for low-grade localized paronychia infection left second digit medial side     Plan:  Reviewed condition and using sterile instrumentation debrided the area and we will allow her to soak and use Band-Aids with Neosporin and if it were to get more wet or become swollen we will start her on an antibiotic but I am hoping I will respond to this conservative treatment rendered today

## 2022-11-08 ENCOUNTER — Ambulatory Visit: Payer: Medicaid Other | Admitting: Family Medicine

## 2022-11-08 ENCOUNTER — Encounter: Payer: Self-pay | Admitting: Family Medicine

## 2022-11-08 VITALS — BP 133/93 | HR 106 | Ht 63.0 in | Wt 154.1 lb

## 2022-11-08 DIAGNOSIS — F900 Attention-deficit hyperactivity disorder, predominantly inattentive type: Secondary | ICD-10-CM | POA: Diagnosis not present

## 2022-11-08 DIAGNOSIS — R030 Elevated blood-pressure reading, without diagnosis of hypertension: Secondary | ICD-10-CM | POA: Diagnosis not present

## 2022-11-08 DIAGNOSIS — E663 Overweight: Secondary | ICD-10-CM

## 2022-11-08 DIAGNOSIS — M5410 Radiculopathy, site unspecified: Secondary | ICD-10-CM

## 2022-11-08 MED ORDER — VITAMIN D (ERGOCALCIFEROL) 1.25 MG (50000 UNIT) PO CAPS: 50000.0000 [IU] | ORAL_CAPSULE | ORAL | 5 refills | Status: DC

## 2022-11-08 MED ORDER — AMPHETAMINE-DEXTROAMPHET ER 30 MG PO CP24: 30.0000 mg | ORAL_CAPSULE | ORAL | 0 refills | Status: DC
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
## 2022-11-08 NOTE — Patient Instructions (Signed)
F/U in 11 weeks , call if you need me sooner  Higher dose of medication is prescribed for  treatment of your ADHD please start the higher dose this would be more effective.  Vitamin D level is low please commit to taking once weekly prescription vitamin D.  You will be referred for evaluation of your lower back with an MRI once we have this organized we will contact you and you will also see a spine surgeon to help with management of your chronic low back pain which is debilitating.  Thanks for choosing [facility], we consider it a privelige to serve you.

## 2022-11-11 DIAGNOSIS — M5410 Radiculopathy, site unspecified: Secondary | ICD-10-CM | POA: Insufficient documentation

## 2022-11-11 DIAGNOSIS — R030 Elevated blood-pressure reading, without diagnosis of hypertension: Secondary | ICD-10-CM

## 2022-11-11 HISTORY — DX: Elevated blood-pressure reading, without diagnosis of hypertension: R03.0

## 2022-11-11 MED ORDER — AMPHETAMINE-DEXTROAMPHET ER 30 MG PO CP24: 30.0000 mg | ORAL_CAPSULE | ORAL | 0 refills | Status: DC

## 2022-11-11 NOTE — Assessment & Plan Note (Signed)
Inadequately treated increase dose adderral

## 2022-11-11 NOTE — Assessment & Plan Note (Signed)
  Patient re-educated about  the importance of commitment to a  minimum of 150 minutes of exercise per week as able.  The importance of healthy food choices with portion control discussed, as well as eating regularly and within a 12 hour window most days. The need to choose "clean , green" food 50 to 75% of the time is discussed, as well as to make water the primary drink and set a goal of 64 ounces water daily.       11/08/2022    3:02 PM 08/29/2022    8:21 AM 08/09/2022    3:49 PM  Weight /BMI  Weight 154 lb 1.3 oz 156 lb 161 lb  Height '5\' 3"'$  (1.6 m) '5\' 3"'$  (1.6 m) '5\' 3"'$  (1.6 m)  BMI 27.29 kg/m2 27.63 kg/m2 28.52 kg/m2

## 2022-11-11 NOTE — Assessment & Plan Note (Signed)
DASH diet and commitment to daily physical activity for a minimum of 30 minutes discussed and encouraged, as a part of hypertension management. The importance of attaining a healthy weight is also discussed.     11/08/2022    3:02 PM 10/29/2022    3:54 PM 08/29/2022    8:21 AM 08/09/2022    4:00 PM 08/09/2022    3:49 PM 07/30/2022    3:20 PM 07/19/2022   10:11 AM  BP/Weight  Systolic BP 268 341 962 229 798 921 194  Diastolic BP 93 65 94 84 97 82 89  Wt. (Lbs) 154.08  156  161  167  BMI 27.29 kg/m2  27.63 kg/m2  28.52 kg/m2  29.58 kg/m2

## 2022-11-11 NOTE — Assessment & Plan Note (Signed)
Uncontrolled pain for over 3 months, aggravated by change in position up to a 10, associated with numbness of left thigh, needs MRI for furhter eval

## 2022-11-13 ENCOUNTER — Ambulatory Visit: Payer: Medicaid Other | Admitting: Orthopaedic Surgery

## 2022-11-19 ENCOUNTER — Ambulatory Visit (HOSPITAL_COMMUNITY): Payer: Medicaid Other

## 2022-11-22 ENCOUNTER — Telehealth: Payer: Self-pay | Admitting: Family Medicine

## 2022-11-22 DIAGNOSIS — M5410 Radiculopathy, site unspecified: Secondary | ICD-10-CM

## 2022-11-22 NOTE — Telephone Encounter (Signed)
Patient called in regard to MRI.  MRI was cancelled due to not being authorized by insurance.  Patient needs  MRI resubmitted.

## 2022-11-23 NOTE — Telephone Encounter (Signed)
Yes it was denied. I put the denial in your box

## 2022-12-03 NOTE — Telephone Encounter (Signed)
The MRI has been approved

## 2022-12-07 ENCOUNTER — Ambulatory Visit (HOSPITAL_COMMUNITY): Payer: Medicaid Other | Attending: Family Medicine

## 2022-12-10 ENCOUNTER — Other Ambulatory Visit: Payer: Self-pay | Admitting: Family Medicine

## 2022-12-10 MED ORDER — AMPHETAMINE-DEXTROAMPHET ER 30 MG PO CP24: 30.0000 mg | ORAL_CAPSULE | ORAL | 0 refills | Status: DC

## 2022-12-18 ENCOUNTER — Ambulatory Visit (HOSPITAL_COMMUNITY)
Admission: RE | Admit: 2022-12-18 | Discharge: 2022-12-18 | Disposition: A | Discharge status: Home / Self Care | Payer: Medicaid Other | Source: Ambulatory Visit | Attending: Family Medicine | Admitting: Family Medicine

## 2022-12-18 DIAGNOSIS — M5410 Radiculopathy, site unspecified: Secondary | ICD-10-CM

## 2022-12-18 DIAGNOSIS — M545 Low back pain, unspecified: Secondary | ICD-10-CM | POA: Diagnosis not present

## 2023-01-24 ENCOUNTER — Ambulatory Visit: Payer: Medicaid Other | Admitting: Family Medicine

## 2023-01-25 ENCOUNTER — Encounter: Payer: Self-pay | Admitting: Family Medicine

## 2023-01-25 ENCOUNTER — Ambulatory Visit (INDEPENDENT_AMBULATORY_CARE_PROVIDER_SITE_OTHER): Payer: Medicaid Other | Admitting: Family Medicine

## 2023-01-25 VITALS — BP 120/84 | HR 106 | Resp 16 | Ht 63.0 in | Wt 153.0 lb

## 2023-01-25 DIAGNOSIS — M5410 Radiculopathy, site unspecified: Secondary | ICD-10-CM | POA: Diagnosis not present

## 2023-01-25 DIAGNOSIS — F900 Attention-deficit hyperactivity disorder, predominantly inattentive type: Secondary | ICD-10-CM | POA: Diagnosis not present

## 2023-01-25 DIAGNOSIS — E663 Overweight: Secondary | ICD-10-CM | POA: Diagnosis not present

## 2023-01-25 NOTE — Patient Instructions (Addendum)
F/U 3rd week in July, call if you need me before  Good that MRI lumbar spine shows mild arthritis only, do back strengthening home exercises   It is important that you exercise regularly at least 30 minutes 5 times a week. If you develop chest pain, have severe difficulty breathing, or feel very tired, stop exercising immediately and seek medical attention   Think about what you will eat, plan ahead. Choose " clean, green, fresh or frozen" over canned, processed or packaged foods which are more sugary, salty and fatty. 70 to 75% of food eaten should be vegetables and fruit. Three meals at set times with snacks allowed between meals, but they must be fruit or vegetables. Aim to eat over a 12 hour period , example 7 am to 7 pm, and STOP after  your last meal of the day. Drink water,generally about 64 ounces per day, no other drink is as healthy. Fruit juice is best enjoyed in a healthy way, by EATING the fruit. Thanks for choosing [facility], we consider it a privelige to serve you.

## 2023-01-26 ENCOUNTER — Encounter: Payer: Self-pay | Admitting: Family Medicine

## 2023-01-26 MED ORDER — AMPHETAMINE-DEXTROAMPHET ER 30 MG PO CP24: 30.0000 mg | ORAL_CAPSULE | ORAL | 0 refills | Status: DC

## 2023-01-26 NOTE — Progress Notes (Signed)
   ARIEA ROCHIN     MRN: [id number]      DOB: [birth year]   HPI Ms. Landgren is here for follow up and re-evaluation of chronic medical conditions, medication management and review of any available recent lab and radiology data.  Preventive health is updated, specifically  Cancer screening and Immunization.   Reports less back pain and is relieved that MRI revealed no major abnormality, no interest in therapy  ROS Denies recent fever or chills. Denies sinus pressure, nasal congestion, ear pain or sore throat. Denies chest congestion, productive cough or wheezing. Denies chest pains, palpitations and leg swelling Denies abdominal pain, nausea, vomiting,diarrhea or constipation.   Denies dysuria, frequency, hesitancy or incontinence. enies headaches, seizures, numbness, or tingling. Denies depression, anxiety or insomnia. Denies skin break down or rash.   PE  BP 120/84   Pulse (!) 106   Resp 16   Ht  (1.6 m)   Wt 153 lb (69.4 kg)   SpO2 96%   BMI 27.10 kg/m   Patient alert and oriented and in no cardiopulmonary distress.  HEENT: No facial asymmetry, EOMI,     Neck supple .  Chest: Clear to auscultation bilaterally.  CVS: S1, S2 no murmurs, no S3.Regular rate.  Ext: No edema  MS: Adequate ROM spine, shoulders, hips and knees.  Skin: Intact, no ulcerations or rash noted.  Psych: Good eye contact, normal affect. Memory intact not anxious or depressed appearing.  CNS: CN 2-12 intact, power,  normal throughout.no focal deficits noted.   Assessment & Plan  ADHD (attention deficit hyperactivity disorder) Reports doing well ,not compliant with meds on review of record, discussed the importance of compliance for improved function, 3 month supply prescribed PDMP reviewed  Overweight (BMI 25.0-29.9)  Patient re-educated about  the importance of commitment to a  minimum of 150 minutes of exercise per week as able.  The importance of healthy food choices with portion  control discussed, as well as eating regularly and within a 12 hour window most days. The need to choose "clean , green" food 50 to 75% of the time is discussed, as well as to make water the primary drink and set a goal of 64 ounces water daily.       01/25/2023    4:36 PM 11/08/2022    3:02 PM 08/29/2022    8:21 AM  Weight /BMI  Weight 153 lb 154 lb 1.3 oz 156 lb  Height  (1.6 m)  (1.6 m)  (1.6 m)  BMI 27.1 kg/m2 27.29 kg/m2 27.63 kg/m2      Back pain with radiculopathy improved

## 2023-01-26 NOTE — Assessment & Plan Note (Signed)
Reports doing well ,not compliant with meds on review of record, discussed the importance of compliance for improved function, 3 month supply prescribed PDMP reviewed

## 2023-01-26 NOTE — Assessment & Plan Note (Signed)
  Patient re-educated about  the importance of commitment to a  minimum of 150 minutes of exercise per week as able.  The importance of healthy food choices with portion control discussed, as well as eating regularly and within a 12 hour window most days. The need to choose "clean , green" food 50 to 75% of the time is discussed, as well as to make water the primary drink and set a goal of 64 ounces water daily.       01/25/2023    4:36 PM 11/08/2022    3:02 PM 08/29/2022    8:21 AM  Weight /BMI  Weight 153 lb 154 lb 1.3 oz 156 lb  Height  (1.6 m)  (1.6 m)  (1.6 m)  BMI 27.1 kg/m2 27.29 kg/m2 27.63 kg/m2

## 2023-01-26 NOTE — Assessment & Plan Note (Signed)
improved

## 2023-02-26 ENCOUNTER — Telehealth: Payer: Medicaid Other | Admitting: Physician Assistant

## 2023-02-26 DIAGNOSIS — H109 Unspecified conjunctivitis: Secondary | ICD-10-CM | POA: Diagnosis not present

## 2023-02-26 MED ORDER — POLYMYXIN B-TRIMETHOPRIM 10000-0.1 UNIT/ML-% OP SOLN: 1.0000 [drp] | OPHTHALMIC | 0 refills | Status: DC

## 2023-02-26 NOTE — Progress Notes (Signed)
Virtual Visit Consent   Danielle Shah, you are scheduled for a virtual visit with a [facility] provider today. Just as with appointments in the office, your consent must be obtained to participate. Your consent will be active for this visit and any virtual visit you may have with one of our providers in the next 365 days. If you have a MyChart account, a copy of this consent can be sent to you electronically.  As this is a virtual visit, video technology does not allow for your provider to perform a traditional examination. This may limit your provider's ability to fully assess your condition. If your provider identifies any concerns that need to be evaluated in person or the need to arrange testing (such as labs, EKG, etc.), we will make arrangements to do so. Although advances in technology are sophisticated, we cannot ensure that it will always work on either your end or our end. If the connection with a video visit is poor, the visit may have to be switched to a telephone visit. With either a video or telephone visit, we are not always able to ensure that we have a secure connection.  By engaging in this virtual visit, you consent to the provision of healthcare and authorize for your insurance to be billed (if applicable) for the services provided during this visit. Depending on your insurance coverage, you may receive a charge related to this service.  I need to obtain your verbal consent now. Are you willing to proceed with your visit today? Danielle Shah has provided verbal consent on 02/26/2023 for a virtual visit (video or telephone). Margaretann Loveless, PA-C  Date: 02/26/2023 8:38 AM  Virtual Visit via Video Note   I, Margaretann Loveless, connected with  Danielle Shah  ([id number], 1985/12/17) on 02/26/23 at  8:30 AM EDT by a video-enabled telemedicine application and verified that I am speaking with the correct person using two identifiers.  Location: Patient: Virtual Visit Location  Patient: Home Provider: Virtual Visit Location Provider: Home Office   I discussed the limitations of evaluation and management by telemedicine and the availability of in person appointments. The patient expressed understanding and agreed to proceed.    History of Present Illness: Danielle Shah is a 37 y.o. who identifies as a female who was assigned female at birth, and is being seen today for possible pink eye.  HPI: Conjunctivitis  The current episode started yesterday (last night). The problem occurs continuously. The problem has been gradually worsening. The problem is mild. Nothing relieves the symptoms. Nothing aggravates the symptoms. Associated symptoms include decreased vision (blurred), eye itching (earlier but now improved), congestion (mild), headaches, eye discharge, eye pain (burning) and eye redness. Pertinent negatives include no fever, no double vision, no photophobia and no sore throat. The eye pain is mild. The left eye is affected. The eye pain is not associated with movement. The eyelid exhibits redness.  Tried cold compresses and saline eye drops    Problems:  Patient Active Problem List   Diagnosis Date Noted   Back pain with radiculopathy 11/11/2022   Elevated blood pressure reading in office without diagnosis of hypertension 11/11/2022   Subacromial bursitis of right shoulder joint 10/23/2022   Acute pain of right shoulder 08/09/2022   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   Normal labor 06/02/2021   ADHD (attention deficit hyperactivity disorder) 01/31/2018   Anxiety and depression 12/14/2017   Depression, major, single episode, severe (HCC) 12/14/2017  Overweight (BMI 25.0-29.9) 11/11/2009   Herpes simplex virus (HSV) infection 10/09/2008    Allergies:  Allergies  Allergen Reactions   Penicillins Hives   Medications:  Current Outpatient Medications:    trimethoprim-polymyxin b (POLYTRIM) ophthalmic solution, Place 1 drop into the left eye every  4 (four) hours. X 5 days, Disp: 10 mL, Rfl: 0   amphetamine-dextroamphetamine (ADDERALL XR) 30 MG 24 hr capsule, Take 1 capsule (30 mg total) by mouth every morning., Disp: 30 capsule, Rfl: 0   amphetamine-dextroamphetamine (ADDERALL XR) 30 MG 24 hr capsule, Take 1 capsule (30 mg total) by mouth every morning., Disp: 30 capsule, Rfl: 0   [START ON 03/27/2023] amphetamine-dextroamphetamine (ADDERALL XR) 30 MG 24 hr capsule, Take 1 capsule (30 mg total) by mouth every morning., Disp: 30 capsule, Rfl: 0   etonogestrel-ethinyl estradiol (NUVARING) 0.12-0.015 MG/24HR vaginal ring, Insert vaginally and leave in place for 3 consecutive weeks, then remove for 1 week., Disp: 3 each, Rfl: 3   Vitamin D, Ergocalciferol, (DRISDOL) 1.25 MG (50000 UNIT) CAPS capsule, Take 1 capsule (50,000 Units total) by mouth every 7 (seven) days., Disp: 5 capsule, Rfl: 5  Observations/Objective: Patient is well-developed, well-nourished in no acute distress.  Resting comfortably at home.  Head is normocephalic, atraumatic.  No labored breathing.  Speech is clear and coherent with logical content.  Patient is alert and oriented at baseline.  Left eye injected  Assessment and Plan: 1. Bacterial conjunctivitis of left eye - trimethoprim-polymyxin b (POLYTRIM) ophthalmic solution; Place 1 drop into the left eye every 4 (four) hours. X 5 days  Dispense: 10 mL; Refill: 0  - Suspect bacterial conjunctivitis - Polytrim prescribed - Warm compresses - Good hand hygiene - Seek in person evaluation if symptoms worsen or fail to improve   Follow Up Instructions: I discussed the assessment and treatment plan with the patient. The patient was provided an opportunity to ask questions and all were answered. The patient agreed with the plan and demonstrated an understanding of the instructions.  A copy of instructions were sent to the patient via MyChart unless otherwise noted below.    The patient was advised to call back or seek  an in-person evaluation if the symptoms worsen or if the condition fails to improve as anticipated.  Time:  I spent 8 minutes with the patient via telehealth technology discussing the above problems/concerns.    Margaretann Loveless, PA-C

## 2023-02-26 NOTE — Patient Instructions (Signed)
Danielle Shah, thank you for joining Margaretann Loveless, PA-C for today's virtual visit.  While this provider is not your primary care provider (PCP), if your PCP is located in our provider database this encounter information will be shared with them immediately following your visit.   A [facility] MyChart account gives you access to today's visit and all your visits, tests, and labs performed at [facility] " click here if you don't have a [facility] MyChart account or go to [email or web address]  Consent: (Patient) Danielle Shah provided verbal consent for this virtual visit at the beginning of the encounter.  Current Medications:  Current Outpatient Medications:    trimethoprim-polymyxin b (POLYTRIM) ophthalmic solution, Place 1 drop into the left eye every 4 (four) hours. X 5 days, Disp: 10 mL, Rfl: 0   amphetamine-dextroamphetamine (ADDERALL XR) 30 MG 24 hr capsule, Take 1 capsule (30 mg total) by mouth every morning., Disp: 30 capsule, Rfl: 0   amphetamine-dextroamphetamine (ADDERALL XR) 30 MG 24 hr capsule, Take 1 capsule (30 mg total) by mouth every morning., Disp: 30 capsule, Rfl: 0   [START ON 03/27/2023] amphetamine-dextroamphetamine (ADDERALL XR) 30 MG 24 hr capsule, Take 1 capsule (30 mg total) by mouth every morning., Disp: 30 capsule, Rfl: 0   etonogestrel-ethinyl estradiol (NUVARING) 0.12-0.015 MG/24HR vaginal ring, Insert vaginally and leave in place for 3 consecutive weeks, then remove for 1 week., Disp: 3 each, Rfl: 3   Vitamin D, Ergocalciferol, (DRISDOL) 1.25 MG (50000 UNIT) CAPS capsule, Take 1 capsule (50,000 Units total) by mouth every 7 (seven) days., Disp: 5 capsule, Rfl: 5   Medications ordered in this encounter:  Meds ordered this encounter  Medications   trimethoprim-polymyxin b (POLYTRIM) ophthalmic solution    Sig: Place 1 drop into the left eye every 4 (four) hours. X 5 days    Dispense:  10 mL    Refill:  0    Order Specific Question:    Supervising Provider    Answer:   Merrilee Jansky [[id number]]     *If you need refills on other medications prior to your next appointment, please contact your pharmacy*  Follow-Up: Call back or seek an in-person evaluation if the symptoms worsen or if the condition fails to improve as anticipated.  [facility] [phone]  Other Instructions Bacterial Conjunctivitis, Adult Bacterial conjunctivitis is an infection of the clear membrane that covers the white part of the eye and the inner surface of the eyelid (conjunctiva). When the blood vessels in the conjunctiva become inflamed, the eye becomes red or pink. The eye often feels irritated or itchy. Bacterial conjunctivitis spreads easily from person to person (is contagious). It also spreads easily from one eye to the other eye. What are the causes? This condition is caused by bacteria. You may get the infection if you come into close contact with: A person who is infected with the bacteria. Items that are contaminated with the bacteria, such as a face towel, contact lens solution, or eye makeup. What increases the risk? You are more likely to develop this condition if: You are exposed to other people who have the infection. You wear contact lenses. You have a sinus infection. You have had a recent eye injury or surgery. You have a weak body defense system (immune system). You have a medical condition that causes dry eyes. What are the signs or symptoms? Symptoms of this condition include: Thick, yellowish discharge from the eye. This may turn into  a crust on the eyelid overnight and cause your eyelids to stick together. Tearing or watery eyes. Itchy eyes. Burning feeling in your eyes. Eye redness. Swollen eyelids. Blurred vision. How is this diagnosed? This condition is diagnosed based on your symptoms and medical history. Your health care provider may also take a sample of discharge from your eye to find the  cause of your infection. How is this treated? This condition may be treated with: Antibiotic eye drops or ointment to clear the infection more quickly and prevent the spread of infection to others. Antibiotic medicines taken by mouth (orally) to treat infections that do not respond to drops or ointments or that last longer than 10 days. Cool, wet cloths (cool compresses) placed on the eyes. Artificial tears applied 2-6 times a day. Follow these instructions at home: Medicines Take or apply your antibiotic medicine as told by your health care provider. Do not stop using the antibiotic, even if your condition improves, unless directed by your health care provider. Take or apply over-the-counter and prescription medicines only as told by your health care provider. Be very careful to avoid touching the edge of your eyelid with the eye-drop bottle or the ointment tube when you apply medicines to the affected eye. This will keep you from spreading the infection to your other eye or to other people. Managing discomfort Gently wipe away any drainage from your eye with a warm, wet washcloth or a cotton ball. Apply a clean, cool compress to your eye for 10-20 minutes, 3-4 times a day. General instructions Do not wear contact lenses until the inflammation is gone and your health care provider says it is safe to wear them again. Ask your health care provider how to sterilize or replace your contact lenses before you use them again. Wear glasses until you can resume wearing contact lenses. Avoid wearing eye makeup until the inflammation is gone. Throw away any old eye cosmetics that may be contaminated. Change or wash your pillowcase every day. Do not share towels or washcloths. This may spread the infection. Wash your hands often with soap and water for at least 20 seconds and especially before touching your face or eyes. Use paper towels to dry your hands. Avoid touching or rubbing your eyes. Do not drive  or use heavy machinery if your vision is blurred. Contact a health care provider if: You have a fever. Your symptoms do not get better after 10 days. Get help right away if: You have a fever and your symptoms suddenly get worse. You have severe pain when you move your eye. You have facial pain, redness, or swelling. You have a sudden loss of vision. Summary Bacterial conjunctivitis is an infection of the clear membrane that covers the white part of the eye and the inner surface of the eyelid (conjunctiva). Bacterial conjunctivitis spreads easily from eye to eye and from person to person (is contagious). Wash your hands often with soap and water for at least 20 seconds and especially before touching your face or eyes. Use paper towels to dry your hands. Take or apply your antibiotic medicine as told by your health care provider. Do not stop using the antibiotic even if your condition improves. Contact a health care provider if you have a fever or if your symptoms do not get better after 10 days. Get help right away if you have a sudden loss of vision. This information is not intended to replace advice given to you by your health care provider.  Make sure you discuss any questions you have with your health care provider. Document Revised: 01/04/2021 Document Reviewed: 01/04/2021 Elsevier Patient Education  2023 Elsevier Inc.    If you have been instructed to have an in-person evaluation today at a local Urgent Care facility, please use the link below. It will take you to a list of all of our available [facility], including address, phone number and hours of operation. Please do not delay care.  [facility]  If you or a family member do not have a primary care provider, use the link below to schedule a visit and establish care. When you choose a [facility] primary care physician or advanced practice provider, you gain a long-term partner in health. Find a Primary  Care Provider  Learn more about [facility]'s in-office and virtual care options: [facility] - Get Care Now

## 2023-04-23 ENCOUNTER — Other Ambulatory Visit: Payer: Self-pay | Admitting: Family Medicine

## 2023-04-26 ENCOUNTER — Ambulatory Visit: Payer: Medicaid Other | Admitting: Family Medicine

## 2023-05-02 ENCOUNTER — Encounter: Payer: Self-pay | Admitting: Family Medicine

## 2023-05-02 ENCOUNTER — Telehealth: Payer: Self-pay | Admitting: Family Medicine

## 2023-05-02 MED ORDER — AMPHETAMINE-DEXTROAMPHET ER 30 MG PO CP24: 30.0000 mg | ORAL_CAPSULE | ORAL | 0 refills | Status: DC

## 2023-05-02 NOTE — Telephone Encounter (Signed)
Patient came by office to reschedule her appointment from Friday, 07.19.2024 our office closed due to outage software issues, patient needed this appointment to get her refill on medication. Can enough refills be sent into her pharmacy til 09.04.2024 next opening with Dr Lodema Hong  amphetamine-dextroamphetamine (ADDERALL XR) 30 MG 24 hr capsule [[id number]]   Pharmacy: [id number] [place], [place] - [address] [place] [address] Phone: [phone]  Fax: [phone] DEA #: [id number]

## 2023-06-12 ENCOUNTER — Encounter: Payer: Self-pay | Admitting: Family Medicine

## 2023-06-12 ENCOUNTER — Ambulatory Visit (INDEPENDENT_AMBULATORY_CARE_PROVIDER_SITE_OTHER): Payer: Medicaid Other | Admitting: Family Medicine

## 2023-06-12 VITALS — BP 120/60 | HR 118 | Ht 63.0 in

## 2023-06-12 DIAGNOSIS — R3912 Poor urinary stream: Secondary | ICD-10-CM | POA: Diagnosis not present

## 2023-06-12 DIAGNOSIS — M7551 Bursitis of right shoulder: Secondary | ICD-10-CM

## 2023-06-12 DIAGNOSIS — E663 Overweight: Secondary | ICD-10-CM | POA: Diagnosis not present

## 2023-06-12 DIAGNOSIS — Z23 Encounter for immunization: Secondary | ICD-10-CM

## 2023-06-12 DIAGNOSIS — F900 Attention-deficit hyperactivity disorder, predominantly inattentive type: Secondary | ICD-10-CM

## 2023-06-12 DIAGNOSIS — R339 Retention of urine, unspecified: Secondary | ICD-10-CM

## 2023-06-12 MED ORDER — PREDNISONE 10 MG PO TABS: 10.0000 mg | ORAL_TABLET | Freq: Two times a day (BID) | ORAL | 0 refills | Status: DC

## 2023-06-12 MED ORDER — MELOXICAM 7.5 MG PO TABS: ORAL_TABLET | ORAL | 1 refills | Status: DC

## 2023-06-12 NOTE — Progress Notes (Signed)
   Danielle Shah     MRN: [id number]      DOB: 07/14/1986  Chief Complaint  Patient presents with   ADHD    F/u   Immunizations    Flu   Shoulder Pain    Rt shoulder pain, is not tolerating shot well would like oral med.    Urinary Retention    Having trouble emptying bladder    HPI Danielle Shah is here for follow up and re-evaluation of chronic medical conditions, medication management and review of any available recent lab and radiology data.  Preventive health is updated, specifically  Cancer screening and Immunization.   Questions or concerns regarding consultations or procedures which the PT has had in the interim are  addressed. The PT denies any adverse reactions to current medications since the last visit.  Difficulty emptying bladder comnpletely has  to change position getting worse,  requests oral med for shoulder pain  ROS Denies recent fever or chills. Denies sinus pressure, nasal congestion, ear pain or sore throat. Denies chest congestion, productive cough or wheezing. Denies chest pains, palpitations and leg swelling Denies abdominal pain, nausea, vomiting,diarrhea or constipation.   Denies headaches, seizures, numbness, or tingling. Denies depression, anxiety or insomnia. Denies skin break down or rash.   PE  BP 120/60 (BP Location: Right Arm, Patient Position: Sitting, Cuff Size: Normal)   Pulse (!) 118   Ht 5\' 3"  (1.6 m)   SpO2 99%   BMI 27.10 kg/m   Patient alert and oriented and in no cardiopulmonary distress.  HEENT: No facial asymmetry, EOMI,     Neck supple .  Chest: Clear to auscultation bilaterally.  CVS: S1, S2 no murmurs, no S3.Regular rate.  ABD: Soft non tender.   Ext: No edema  MS: decreased ROM right shoulder Skin: Intact, no ulcerations or rash noted.  Psych: Good eye contact, normal affect. Memory intact not anxious or depressed appearing.  CNS: CN 2-12 intact, power,  normal throughout.no focal deficits noted.   Assessment &  Plan  Subacromial bursitis of right shoulder joint Pain and reduced mobility persist, short course of  oral prednisone, and meloxicam as needed for daily use  ADHD (attention deficit hyperactivity disorder) Controlled, no change in medication   Overweight (BMI 25.0-29.9)  Patient re-educated about  the importance of commitment to a  minimum of 150 minutes of exercise per week as able.  The importance of healthy food choices with portion control discussed, as well as eating regularly and within a 12 hour window most days. The need to choose "clean , green" food 50 to 75% of the time is discussed, as well as to make water the primary drink and set a goal of 64 ounces water daily.       06/12/2023    3:29 PM 01/25/2023    4:36 PM 11/08/2022    3:02 PM  Weight /BMI  Weight  153 lb 154 lb 1.3 oz  Height 5\' 3"  (1.6 m) 5\' 3"  (1.6 m) 5\' 3"  (1.6 m)  BMI 27.1 kg/m2 27.1 kg/m2 27.29 kg/m2    unchanged  Poor urinary stream Progressively worsening x 12 months, has to change positions to void, refer Urology  Incomplete bladder emptying Has to change positions to void, with poor stream refer urology

## 2023-06-12 NOTE — Patient Instructions (Addendum)
F/U in 14 to 15 weeks, call if you need me sooner   Flu vaccine today  You are referred to Urology  Short course of prednisone and meloxicam for daily use as needed, are prescribed  Thanks for choosing [facility], we consider it a privelige to serve you.

## 2023-06-17 ENCOUNTER — Encounter: Payer: Self-pay | Admitting: Family Medicine

## 2023-06-17 DIAGNOSIS — R339 Retention of urine, unspecified: Secondary | ICD-10-CM | POA: Insufficient documentation

## 2023-06-17 DIAGNOSIS — R3912 Poor urinary stream: Secondary | ICD-10-CM | POA: Insufficient documentation

## 2023-06-17 MED ORDER — AMPHETAMINE-DEXTROAMPHET ER 30 MG PO CP24: 30.0000 mg | ORAL_CAPSULE | ORAL | 0 refills | Status: DC

## 2023-06-17 MED ORDER — AMPHETAMINE-DEXTROAMPHET ER 30 MG PO CP24: 30.0000 mg | ORAL_CAPSULE | ORAL | 0 refills | Status: AC

## 2023-06-17 MED ORDER — AMPHETAMINE-DEXTROAMPHET ER 30 MG PO CP24: 30.0000 mg | ORAL_CAPSULE | Freq: Every day | ORAL | 0 refills | Status: DC

## 2023-06-17 NOTE — Assessment & Plan Note (Signed)
Controlled, no change in medication  

## 2023-06-17 NOTE — Assessment & Plan Note (Signed)
  Patient re-educated about  the importance of commitment to a  minimum of 150 minutes of exercise per week as able.  The importance of healthy food choices with portion control discussed, as well as eating regularly and within a 12 hour window most days. The need to choose "clean , green" food 50 to 75% of the time is discussed, as well as to make water the primary drink and set a goal of 64 ounces water daily.       06/12/2023    3:29 PM 01/25/2023    4:36 PM 11/08/2022    3:02 PM  Weight /BMI  Weight  153 lb 154 lb 1.3 oz  Height 5\' 3"  (1.6 m) 5\' 3"  (1.6 m) 5\' 3"  (1.6 m)  BMI 27.1 kg/m2 27.1 kg/m2 27.29 kg/m2    unchanged

## 2023-06-17 NOTE — Assessment & Plan Note (Signed)
Progressively worsening x 12 months, has to change positions to void, refer Urology

## 2023-06-17 NOTE — Assessment & Plan Note (Addendum)
Pain and reduced mobility persist, short course of  oral prednisone, and meloxicam as needed for daily use

## 2023-06-17 NOTE — Assessment & Plan Note (Signed)
Has to change positions to void, with poor stream refer urology

## 2023-07-10 NOTE — Progress Notes (Signed)
Name: Danielle Shah DOB: 1986/08/21 MRN: [id number]  History of Present Illness: [name] is a 37 y.o. female who presents today as a new patient at [facility]. All available relevant medical records have been reviewed.  She reports chief complaint of weak slow urinary stream and some mild urinary hesitancy. Symptoms started approximately 4 months after her second vaginal delivery in August 2022 and have been constant since then. Denies straining to void and sensations of incomplete emptying. Reports doing positional voiding to empty adequately. 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  She denies increased urinary urgency, frequency, incontinence, dysuria, gross hematuria.  She denies history of recent or recurrent UTI.  She denies flank pain or abdominal pain. She has a history of chronic back pain. Reports alternating constipation & diarrhea due to IBS.    Fall Screening: Do you usually have a device to assist in your mobility? No   Medications: Current Outpatient Medications  Medication Sig Dispense Refill   [START ON 07/17/2023] amphetamine-dextroamphetamine (ADDERALL XR) 30 MG 24 hr capsule Take 1 capsule (30 mg total) by mouth every morning. 30 capsule 0   etonogestrel-ethinyl estradiol (NUVARING) 0.12-0.015 MG/24HR vaginal ring INSERT 1 RING VAGINALLY AS DIRECTED. REMOVE AFTER 3 WEEKS & WAIT 7 DAYS BEFORE INSERTING A NEW RING 3 each 3   meloxicam (MOBIC) 7.5 MG tablet Take one tablet by mouth once daily, as needed, for shoulder pain 30 tablet 1   predniSONE (DELTASONE) 10 MG tablet Take 1 tablet (10 mg total) by mouth 2 (two) times daily with a meal. 10 tablet 0   tamsulosin (FLOMAX) 0.4 MG CAPS capsule Take 1 capsule (0.4 mg total) by mouth daily. 30 capsule 11   trimethoprim-polymyxin b (POLYTRIM) ophthalmic solution Place 1 drop into the left eye every 4 (four) hours. X 5 days 10 mL 0   Vitamin D,  Ergocalciferol, (DRISDOL) 1.25 MG (50000 UNIT) CAPS capsule Take 1 capsule (50,000 Units total) by mouth every 7 (seven) days. 5 capsule 5   amphetamine-dextroamphetamine (ADDERALL XR) 30 MG 24 hr capsule Take 1 capsule (30 mg total) by mouth every morning. 30 capsule 0   amphetamine-dextroamphetamine (ADDERALL XR) 30 MG 24 hr capsule Take 1 capsule (30 mg total) by mouth every morning. 30 capsule 0   amphetamine-dextroamphetamine (ADDERALL XR) 30 MG 24 hr capsule Take 1 capsule (30 mg total) by mouth every morning. 30 capsule 0   [START ON 08/16/2023] amphetamine-dextroamphetamine (ADDERALL XR) 30 MG 24 hr capsule Take 1 capsule (30 mg total) by mouth every morning. 30 capsule 0   No current facility-administered medications for this visit.    Allergies: Allergies  Allergen Reactions   Penicillins Hives    Past Medical History:  Diagnosis Date   Anemia    Anxiety    Depression    Fibroid    Obesity (BMI 30.0-34.9)    Past Surgical History:  Procedure Laterality Date   EYE SURGERY Right 2003   WISDOM TOOTH EXTRACTION  2004   Family History  Problem Relation Age of Onset   Heart disease Mother        CHF   Arthritis Mother    Hyperlipidemia Mother    Hypertension Mother    Social History   Socioeconomic History   Marital status: Married    Spouse name: Not on file   Number of children: Not on file   Years of education: Not on file  Highest education level: Not on file  Occupational History   Not on file  Tobacco Use   Smoking status: Former    Types: Cigarettes   Smokeless tobacco: Never  Vaping Use   Vaping status: Never Used  Substance and Sexual Activity   Alcohol use: Not Currently    Comment: social drinking   Drug use: Not Currently    Types: Marijuana    Comment: last use thanksgiving   Sexual activity: Yes    Birth control/protection: Other-see comments    Comment: undecided  Other Topics Concern   Not on file  Social History Narrative   Not on  file   Social Determinants of Health   Financial Resource Strain: Not on file  Food Insecurity: Not on file  Transportation Needs: Not on file  Physical Activity: Not on file  Stress: Not on file  Social Connections: Not on file  Intimate Partner Violence: Not on file    SUBJECTIVE  Review of Systems Constitutional: Patient denies any unintentional weight loss or change in strength lntegumentary: Patient denies any rashes or pruritus Cardiovascular: Patient denies chest pain or syncope Respiratory: Patient denies shortness of breath Gastrointestinal: As per HPI Musculoskeletal: Patient denies muscle cramps or weakness Neurologic: Patient denies convulsions or seizures Allergic/Immunologic: Patient denies recent allergic reaction(s) Hematologic/Lymphatic: Patient denies bleeding tendencies Endocrine: Patient denies heat/cold intolerance  GU: As per HPI.  OBJECTIVE Vitals:   07/15/23 1018 07/15/23 1031  BP: (!) 147/108 (!) 133/98  Pulse: 93 93  Temp: 98.9 F (37.2 C)    There is no height or weight on file to calculate BMI.  Physical Examination Constitutional: No obvious distress; patient is non-toxic appearing  Cardiovascular: No visible lower extremity edema.  Respiratory: The patient does not have audible wheezing/stridor; respirations do not appear labored  Gastrointestinal: Abdomen non-distended Musculoskeletal: Normal ROM of UEs  Skin: No obvious rashes/open sores  Neurologic: CN 2-12 grossly intact Psychiatric: Answered questions appropriately with normal affect  Hematologic/Lymphatic/Immunologic: No obvious bruises or sites of spontaneous bleeding  UA: 0-5 WBC/hpf, >30 RBC/hpf, bacteria (none). Specimen likely contaminated - patient reports she is currently having her menstrual cycle. PVR: 0 ml  ASSESSMENT Abnormal urinary stream - Plan: Urinalysis, Routine w reflex microscopic, BLADDER SCAN AMB NON-IMAGING, tamsulosin (FLOMAX) 0.4 MG CAPS  capsule  Feeling of incomplete bladder emptying - Plan: Urinalysis, Routine w reflex microscopic, BLADDER SCAN AMB NON-IMAGING  No acute findings - normal PVR. We discussed possible etiologies for her weak urinary stream and urinary hesitancy including but not limited to urethra stricture or stenosis, high-tone pelvic floor dysfunction, neurogenic bladder, etc. Advised trial of Flomax 0.4 mg daily and further evaluation via cystoscopy. Pt verbalized understanding and agreement. All questions were answered.  PLAN Advised the following: Flomax 0.4 mg daily. Return for 1st available cystoscopy with any urology MD.  Orders Placed This Encounter  Procedures   Urinalysis, Routine w reflex microscopic   BLADDER SCAN AMB NON-IMAGING    It has been explained that the patient is to follow regularly with their PCP in addition to all other providers involved in their care and to follow instructions provided by these respective offices. Patient advised to contact urology clinic if any urologic-pertaining questions, concerns, new symptoms or problems arise in the interim period.  There are no Patient Instructions on file for this visit.  Electronically signed by:  Donnita Falls, MSN, FNP-C, CUNP 07/15/2023 10:54 AM

## 2023-07-15 ENCOUNTER — Encounter: Payer: Self-pay | Admitting: Urology

## 2023-07-15 ENCOUNTER — Ambulatory Visit (INDEPENDENT_AMBULATORY_CARE_PROVIDER_SITE_OTHER): Payer: Medicaid Other | Admitting: Urology

## 2023-07-15 VITALS — BP 133/98 | HR 93 | Temp 98.9°F

## 2023-07-15 DIAGNOSIS — R39198 Other difficulties with micturition: Secondary | ICD-10-CM | POA: Diagnosis not present

## 2023-07-15 DIAGNOSIS — R3914 Feeling of incomplete bladder emptying: Secondary | ICD-10-CM | POA: Diagnosis not present

## 2023-07-15 LAB — URINALYSIS, ROUTINE W REFLEX MICROSCOPIC
Bilirubin, UA: NEGATIVE
Glucose, UA: NEGATIVE
Ketones, UA: NEGATIVE
Leukocytes,UA: NEGATIVE
Nitrite, UA: NEGATIVE
Protein,UA: NEGATIVE
Specific Gravity, UA: 1.02 (ref 1.005–1.030)
Urobilinogen, Ur: 0.2 mg/dL (ref 0.2–1.0)
pH, UA: 7 (ref 5.0–7.5)

## 2023-07-15 LAB — MICROSCOPIC EXAMINATION
Bacteria, UA: NONE SEEN
RBC, Urine: >30 /[HPF] — AB (ref 0–2)

## 2023-07-15 LAB — BLADDER SCAN AMB NON-IMAGING: Scan Result: 0

## 2023-07-15 MED ORDER — TAMSULOSIN HCL 0.4 MG PO CAPS: 0.4000 mg | ORAL_CAPSULE | Freq: Every day | ORAL | 11 refills | Status: AC

## 2023-08-12 ENCOUNTER — Encounter: Payer: Self-pay | Admitting: Urology

## 2023-08-12 ENCOUNTER — Ambulatory Visit: Payer: Medicaid Other | Admitting: Urology

## 2023-08-12 VITALS — BP 161/90 | HR 108

## 2023-08-12 DIAGNOSIS — R3914 Feeling of incomplete bladder emptying: Secondary | ICD-10-CM

## 2023-08-12 DIAGNOSIS — R39198 Other difficulties with micturition: Secondary | ICD-10-CM | POA: Diagnosis not present

## 2023-08-12 LAB — URINALYSIS, ROUTINE W REFLEX MICROSCOPIC
Bilirubin, UA: NEGATIVE
Glucose, UA: NEGATIVE
Ketones, UA: NEGATIVE
Leukocytes,UA: NEGATIVE
Nitrite, UA: NEGATIVE
Protein,UA: NEGATIVE
RBC, UA: NEGATIVE
Specific Gravity, UA: 1.02 (ref 1.005–1.030)
Urobilinogen, Ur: 1 mg/dL (ref 0.2–1.0)
pH, UA: 7.5 (ref 5.0–7.5)

## 2023-08-12 MED ORDER — CIPROFLOXACIN HCL 500 MG PO TABS
500.0000 mg | ORAL_TABLET | Freq: Once | ORAL | Status: AC
  Administered 2023-08-12: 500 mg via ORAL

## 2023-08-12 NOTE — Progress Notes (Unsigned)
   08/12/23  CC: No chief complaint on file.   HPI:  Danielle Shah is here for cystoscopy.  She has no dysuria or gross hematuria.  She saw Maralyn Sago in October 2024 for a weak stream with hesitancy.  She was started on tamsulosin.  Symptoms started about 4 months after her second vaginal delivery in August 2022 and have been constant.  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  She does not have a frequency and urgency component nor any UTI.  Tamsulosin has improved her stream but she feels like she has some frequency taking it.  She has IBS with alternating constipation and diarrhea.  currently breastfeeding. NED. A&Ox3.   No respiratory distress   Abd soft, NT, ND Normal external genitalia with patent urethral meatus Good support without prolapse.  Her bladder and urethra were palpably normal without mass or tenderness.  Chaperone for pelvic exam and cystoscopy-Hope  Cystoscopy Procedure Note  Patient identification was confirmed, informed consent was obtained, and patient was prepped using Betadine solution.  Lidocaine jelly was administered per urethral meatus.    Procedure: - Flexible cystoscope introduced, without any difficulty.   - Thorough search of the bladder revealed:    normal urethral meatus    normal urothelium    no stones    no ulcers     no tumors    no urethral polyps    no trabeculation  - Ureteral orifices were normal in position and appearance.  Post-Procedure: - Patient tolerated the procedure well  Assessment/ Plan:  Weak stream-normal cystoscopy and exam.  She can continue to manage symptoms through medications or physical therapy.  She was reassured.   No follow-ups on file.  Jerilee Field, MD

## 2023-09-19 NOTE — Progress Notes (Unsigned)
Name: Danielle Shah DOB: 03/31/1986 MRN: [id number]  History of Present Illness: Danielle Shah is a 37 y.o. female who presents today for follow up visit at [facility] [place].  ***She is accompanied by ***. - GU history: 1. Voiding dysfunction including weak stream and urinary hesitancy.   - Symptoms started about 4 months after her second vaginal delivery in August 2022 and have been constant.  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 - ***Taking Flomax 0.4 mg daily.   At last visit with Dr. Mena Goes on 08/12/2023: - "Tamsulosin has improved her stream but she feels like she has some frequency taking it." - Cystoscopy and pelvic exam were unremarkable. - "She can continue to manage symptoms through medications or physical therapy."  Today: She reports ***  She {Actions; denies-reports:120008} increased urinary urgency, frequency, nocturia, dysuria, gross hematuria, hesitancy, straining to void, or sensations of incomplete emptying.   Fall Screening: Do you usually have a device to assist in your mobility? {yes/no:20286} ***cane / ***walker / ***wheelchair   Medications: Current Outpatient Medications  Medication Sig Dispense Refill   amphetamine-dextroamphetamine (ADDERALL XR) 30 MG 24 hr capsule Take 1 capsule (30 mg total) by mouth every morning. 30 capsule 0   amphetamine-dextroamphetamine (ADDERALL XR) 30 MG 24 hr capsule Take 1 capsule (30 mg total) by mouth every morning. 30 capsule 0   amphetamine-dextroamphetamine (ADDERALL XR) 30 MG 24 hr capsule Take 1 capsule (30 mg total) by mouth every morning. 30 capsule 0   amphetamine-dextroamphetamine (ADDERALL XR) 30 MG 24 hr capsule Take 1 capsule (30 mg total) by mouth every morning. 30 capsule 0   amphetamine-dextroamphetamine (ADDERALL XR) 30 MG 24 hr capsule Take 1 capsule (30 mg total) by mouth every morning. 30 capsule 0   etonogestrel-ethinyl  estradiol (NUVARING) 0.12-0.015 MG/24HR vaginal ring INSERT 1 RING VAGINALLY AS DIRECTED. REMOVE AFTER 3 WEEKS & WAIT 7 DAYS BEFORE INSERTING A NEW RING 3 each 3   meloxicam (MOBIC) 7.5 MG tablet Take one tablet by mouth once daily, as needed, for shoulder pain 30 tablet 1   predniSONE (DELTASONE) 10 MG tablet Take 1 tablet (10 mg total) by mouth 2 (two) times daily with a meal. (Patient not taking: Reported on 08/12/2023) 10 tablet 0   tamsulosin (FLOMAX) 0.4 MG CAPS capsule Take 1 capsule (0.4 mg total) by mouth daily. 30 capsule 11   trimethoprim-polymyxin b (POLYTRIM) ophthalmic solution Place 1 drop into the left eye every 4 (four) hours. X 5 days (Patient not taking: Reported on 08/12/2023) 10 mL 0   Vitamin D, Ergocalciferol, (DRISDOL) 1.25 MG (50000 UNIT) CAPS capsule Take 1 capsule (50,000 Units total) by mouth every 7 (seven) days. 5 capsule 5   No current facility-administered medications for this visit.    Allergies: Allergies  Allergen Reactions   Penicillins Hives    Past Medical History:  Diagnosis Date   Anemia    Anxiety    Depression    Fibroid    Obesity (BMI 30.0-34.9)    Past Surgical History:  Procedure Laterality Date   EYE SURGERY Right 2003   WISDOM TOOTH EXTRACTION  2004   Family History  Problem Relation Age of Onset   Heart disease Mother        CHF   Arthritis Mother    Hyperlipidemia Mother    Hypertension Mother    Social History   Socioeconomic History   Marital status: Married  Spouse name: Not on file   Number of children: Not on file   Years of education: Not on file   Highest education level: Not on file  Occupational History   Not on file  Tobacco Use   Smoking status: Former    Types: Cigarettes   Smokeless tobacco: Never  Vaping Use   Vaping status: Never Used  Substance and Sexual Activity   Alcohol use: Not Currently    Comment: social drinking   Drug use: Not Currently    Types: Marijuana    Comment: last use  thanksgiving   Sexual activity: Yes    Birth control/protection: Other-see comments    Comment: undecided  Other Topics Concern   Not on file  Social History Narrative   Not on file   Social Drivers of Health   Financial Resource Strain: Not on file  Food Insecurity: Not on file  Transportation Needs: Not on file  Physical Activity: Not on file  Stress: Not on file  Social Connections: Not on file  Intimate Partner Violence: Not on file    Review of Systems*** Constitutional: Patient denies any unintentional weight loss or change in strength lntegumentary: Patient denies any rashes or pruritus Cardiovascular: Patient denies chest pain or syncope Respiratory: Patient denies shortness of breath Gastrointestinal: Patient ***denies nausea, vomiting, constipation, or diarrhea Musculoskeletal: Patient denies muscle cramps or weakness Neurologic: Patient denies convulsions or seizures Allergic/Immunologic: Patient denies recent allergic reaction(s) Hematologic/Lymphatic: Patient denies bleeding tendencies Endocrine: Patient denies heat/cold intolerance  GU: As per HPI.  OBJECTIVE There were no vitals filed for this visit. There is no height or weight on file to calculate BMI.  Physical Examination*** Constitutional: No obvious distress; patient is non-toxic appearing  Cardiovascular: No visible lower extremity edema.  Respiratory: The patient does not have audible wheezing/stridor; respirations do not appear labored  Gastrointestinal: Abdomen non-distended Musculoskeletal: Normal ROM of UEs  Skin: No obvious rashes/open sores  Neurologic: CN 2-12 grossly intact Psychiatric: Answered questions appropriately with normal affect  Hematologic/Lymphatic/Immunologic: No obvious bruises or sites of spontaneous bleeding  UA: ***negative *** WBC/hpf, *** RBC/hpf, *** bacteria ***with no evidence of UTI ***with no evidence of microscopic hematuria PVR: *** ml  ASSESSMENT No  diagnosis found. ***  Will plan for follow up in *** months / ***1 year or sooner if needed. Pt verbalized understanding and agreement. All questions were answered.  PLAN Advised the following: 1. *** 2. ***No follow-ups on file.  No orders of the defined types were placed in this encounter.   It has been explained that the patient is to follow regularly with their PCP in addition to all other providers involved in their care and to follow instructions provided by these respective offices. Patient advised to contact urology clinic if any urologic-pertaining questions, concerns, new symptoms or problems arise in the interim period.  There are no Patient Instructions on file for this visit.  Electronically signed by:  Donnita Falls, FNP   09/19/23    1:13 PM

## 2023-09-23 ENCOUNTER — Ambulatory Visit: Payer: Medicaid Other | Admitting: Urology

## 2023-09-23 DIAGNOSIS — N398 Other specified disorders of urinary system: Secondary | ICD-10-CM

## 2023-09-25 ENCOUNTER — Ambulatory Visit (INDEPENDENT_AMBULATORY_CARE_PROVIDER_SITE_OTHER): Payer: Medicaid Other | Admitting: Family Medicine

## 2023-09-25 VITALS — BP 138/92 | HR 100 | Ht 63.0 in | Wt 158.1 lb

## 2023-09-25 DIAGNOSIS — E663 Overweight: Secondary | ICD-10-CM | POA: Diagnosis not present

## 2023-09-25 DIAGNOSIS — F900 Attention-deficit hyperactivity disorder, predominantly inattentive type: Secondary | ICD-10-CM

## 2023-09-25 DIAGNOSIS — E559 Vitamin D deficiency, unspecified: Secondary | ICD-10-CM | POA: Diagnosis not present

## 2023-09-25 DIAGNOSIS — Z13220 Encounter for screening for lipoid disorders: Secondary | ICD-10-CM

## 2023-09-25 DIAGNOSIS — E785 Hyperlipidemia, unspecified: Secondary | ICD-10-CM

## 2023-09-25 NOTE — Patient Instructions (Addendum)
Annual exam in 14 weeks, call if you need me sooner  Continue current meds , you are doing very well  Fasting CBC, lipid, cmp and eGFR, TSH and viot D within next 2 weeks, [facility] in [place]  It is important that you exercise regularly at least 30 minutes 5 times a week. If you develop chest pain, have severe difficulty breathing, or feel very tired, stop exercising immediately and seek medical attention  Think about what you will eat, plan ahead. Choose " clean, green, fresh or frozen" over canned, processed or packaged foods which are more sugary, salty and fatty. 70 to 75% of food eaten should be vegetables and fruit. Three meals at set times with snacks allowed between meals, but they must be fruit or vegetables. Aim to eat over a 12 hour period , example 7 am to 7 pm, and STOP after  your last meal of the day. Drink water,generally about 64 ounces per day, no other drink is as healthy. Fruit juice is best enjoyed in a healthy way, by EATING the fruit.  Thanks for choosing [facility], we consider it a privelige to serve you.

## 2023-10-16 DIAGNOSIS — E785 Hyperlipidemia, unspecified: Secondary | ICD-10-CM | POA: Insufficient documentation

## 2023-10-16 DIAGNOSIS — E559 Vitamin D deficiency, unspecified: Secondary | ICD-10-CM | POA: Insufficient documentation

## 2023-10-16 MED ORDER — AMPHETAMINE-DEXTROAMPHET ER 30 MG PO CP24: 30.0000 mg | ORAL_CAPSULE | ORAL | 0 refills | Status: DC

## 2023-10-16 NOTE — Assessment & Plan Note (Signed)
 Updated lab needed at/ before next visit.

## 2023-10-16 NOTE — Assessment & Plan Note (Signed)
 Hyperlipidemia:Low fat diet discussed and encouraged.   Lipid Panel  Lab Results  Component Value Date   CHOL 186 05/17/2022   HDL 45 05/17/2022   LDLCALC 111 (H) 05/17/2022   TRIG 168 (H) 05/17/2022   CHOLHDL 4.1 05/17/2022     Updated lab needed at/ before next visit.

## 2023-10-16 NOTE — Assessment & Plan Note (Signed)
 Controlled, no change in medication

## 2023-10-16 NOTE — Assessment & Plan Note (Signed)
  Patient re-educated about  the importance of commitment to a  minimum of 150 minutes of exercise per week as able.  The importance of healthy food choices with portion control discussed, as well as eating regularly and within a 12 hour window most days. The need to choose clean , green food 50 to 75% of the time is discussed, as well as to make water the primary drink and set a goal of 64 ounces water daily.       09/25/2023    3:27 PM 06/12/2023    3:29 PM 01/25/2023    4:36 PM  Weight /BMI  Weight 158 lb 1.9 oz  153 lb  Height 5' 3 (1.6 m) 5' 3 (1.6 m) 5' 3 (1.6 m)  BMI 28.01 kg/m2 27.1 kg/m2 27.1 kg/m2

## 2023-11-06 ENCOUNTER — Other Ambulatory Visit: Payer: Self-pay | Admitting: Family Medicine

## 2024-01-03 ENCOUNTER — Ambulatory Visit (INDEPENDENT_AMBULATORY_CARE_PROVIDER_SITE_OTHER): Payer: Medicaid Other | Admitting: Family Medicine

## 2024-01-03 ENCOUNTER — Encounter: Payer: Self-pay | Admitting: Family Medicine

## 2024-01-03 VITALS — BP 128/82 | HR 108 | Resp 16 | Ht 63.0 in | Wt 156.0 lb

## 2024-01-03 DIAGNOSIS — J309 Allergic rhinitis, unspecified: Secondary | ICD-10-CM | POA: Diagnosis not present

## 2024-01-03 DIAGNOSIS — J40 Bronchitis, not specified as acute or chronic: Secondary | ICD-10-CM | POA: Diagnosis not present

## 2024-01-03 DIAGNOSIS — E663 Overweight: Secondary | ICD-10-CM | POA: Diagnosis not present

## 2024-01-03 DIAGNOSIS — E559 Vitamin D deficiency, unspecified: Secondary | ICD-10-CM | POA: Diagnosis not present

## 2024-01-03 DIAGNOSIS — Z0001 Encounter for general adult medical examination with abnormal findings: Secondary | ICD-10-CM

## 2024-01-03 DIAGNOSIS — F900 Attention-deficit hyperactivity disorder, predominantly inattentive type: Secondary | ICD-10-CM | POA: Diagnosis not present

## 2024-01-03 DIAGNOSIS — Z13220 Encounter for screening for lipoid disorders: Secondary | ICD-10-CM | POA: Diagnosis not present

## 2024-01-03 DIAGNOSIS — Z1329 Encounter for screening for other suspected endocrine disorder: Secondary | ICD-10-CM | POA: Diagnosis not present

## 2024-01-03 MED ORDER — BENZONATATE 100 MG PO CAPS: 200.0000 mg | ORAL_CAPSULE | Freq: Two times a day (BID) | ORAL | 0 refills | Status: DC | PRN

## 2024-01-03 MED ORDER — AZELASTINE HCL 0.1 % NA SOLN: 2.0000 | Freq: Two times a day (BID) | NASAL | 12 refills | Status: AC

## 2024-01-03 MED ORDER — MONTELUKAST SODIUM 10 MG PO TABS: 10.0000 mg | ORAL_TABLET | Freq: Every day | ORAL | 3 refills | Status: AC

## 2024-01-03 MED ORDER — AZITHROMYCIN 250 MG PO TABS: ORAL_TABLET | ORAL | 0 refills | Status: AC

## 2024-01-03 NOTE — Patient Instructions (Addendum)
 F/U in 4 months, call if you need me sooner  Labs already ordered today please , no labs since 2023!  Medication is prescribed  for allergies and also for chronic bronchitis  It is important that you exercise regularly at least 30 minutes 5 times a week. If you develop chest pain, have severe difficulty breathing, or feel very tired, stop exercising immediately and seek medical attention   Think about what you will eat, plan ahead. Choose " clean, green, fresh or frozen" over canned, processed or packaged foods which are more sugary, salty and fatty. 70 to 75% of food eaten should be vegetables and fruit. Three meals at set times with snacks allowed between meals, but they must be fruit or vegetables. Aim to eat over a 12 hour period , example 7 am to 7 pm, and STOP after  your last meal of the day. Drink water,generally about 64 ounces per day, no other drink is as healthy. Fruit juice is best enjoyed in a healthy way, by EATING the fruit.  Thanks for choosing [facility], we consider it a privelige to serve you.

## 2024-01-03 NOTE — Progress Notes (Signed)
    Danielle Shah     MRN: [id number]      DOB: 1986-02-08  Chief Complaint  Patient presents with   Annual Exam   Cough    Coughing up greenish yellow mucus, and sinus congestion x almost 6 weeks. Has tried otc xyzal, without much improvement    Medication Refill    States a med refill was sent in January and it was never responded to. If still should be on needs a refill     HPI: Patient is in for annual physical exam. C/o 6 week h/o head and chest congestion with greenish nasal drainage and sputum Needs med refilled Immunization is reviewed , and  updated if needed.   PE: BP 128/82   Pulse (!) 108   Resp 16   Ht 5\' 3"  (1.6 m)   Wt 156 lb (70.8 kg)   SpO2 95%   BMI 27.63 kg/m   Pleasant  female, alert and oriented x 3, in no cardio-pulmonary distress. Afebrile. HEENT No facial trauma or asymetry. Sinuses non tender. TM clear bilaterally, oropharynx no erythema or exudate Extra occullar muscles intact.. External ears normal, . Neck: supple, no adenopathy,JVD or thyromegaly.No bruits.  Chest: Adequate air entry .No crackles or wheezes. Non tender to palpation  Breast: No asymetry,no masses or lumps. No tenderness. No nipple discharge or inversion. No axillary or supraclavicular adenopathy  Cardiovascular system; Heart sounds normal,  S1 and  S2 ,no S3.  No murmur, or thrill. Apical beat not displaced Peripheral pulses normal.  Abdomen: Soft, non tender, no organomegaly or masses. No bruits. Bowel sounds normal. No guarding, tenderness or rebound.   GU: Asymptomatic, not examined   Musculoskeletal exam: Decreased  ROM of lumbar spine, adequate in hips , shoulders and knees. No deformity ,swelling or crepitus noted. No muscle wasting or atrophy.   Neurologic: Cranial nerves 2 to 12 intact. Power, tone ,sensation and reflexes normal throughout. No disturbance in gait. No tremor.  Skin: Intact, no ulceration, erythema , scaling or rash  noted. Pigmentation normal throughout  Psych; Normal mood and affect. Judgement and concentration normal   Assessment & Plan:  Annual visit for general adult medical examination with abnormal findings Annual exam as documented. Counseling done  re healthy lifestyle involving commitment to 150 minutes exercise per week, heart healthy diet, and attaining healthy weight.The importance of adequate sleep also discussed. Regular seat belt use and home safety, is also discussed. . Immunization and cancer screening needs are specifically addressed at this visit.   Bronchitis Tessalon perle and Z pack prescribed  Allergic rhinosinusitis Daily montelukast for symptom control  ADHD (attention deficit hyperactivity disorder) Controlled, no change in medication

## 2024-01-04 LAB — LIPID PANEL
Chol/HDL Ratio: 3.4 ratio (ref 0.0–4.4)
Cholesterol, Total: 206 mg/dL — ABNORMAL HIGH (ref 100–199)
HDL: 60 mg/dL (ref >39)
LDL Chol Calc (NIH): 130 mg/dL — ABNORMAL HIGH (ref 0–99)
Triglycerides: 91 mg/dL (ref 0–149)
VLDL Cholesterol Cal: 16 mg/dL (ref 5–40)

## 2024-01-04 LAB — CMP14+EGFR
ALT: 22 IU/L (ref 0–32)
AST: 21 IU/L (ref 0–40)
Albumin: 4.6 g/dL (ref 3.9–4.9)
Alkaline Phosphatase: 84 IU/L (ref 44–121)
BUN/Creatinine Ratio: 14 (ref 9–23)
BUN: 9 mg/dL (ref 6–20)
Bilirubin Total: 0.2 mg/dL (ref 0.0–1.2)
CO2: 18 mmol/L — ABNORMAL LOW (ref 20–29)
Calcium: 9.8 mg/dL (ref 8.7–10.2)
Chloride: 101 mmol/L (ref 96–106)
Creatinine, Ser: 0.64 mg/dL (ref 0.57–1.00)
Globulin, Total: 2.7 g/dL (ref 1.5–4.5)
Glucose: 86 mg/dL (ref 70–99)
Potassium: 4.2 mmol/L (ref 3.5–5.2)
Sodium: 138 mmol/L (ref 134–144)
Total Protein: 7.3 g/dL (ref 6.0–8.5)
eGFR: 116 mL/min/{1.73_m2} (ref >59)

## 2024-01-04 LAB — CBC
Hematocrit: 39.3 % (ref 34.0–46.6)
Hemoglobin: 13.4 g/dL (ref 11.1–15.9)
MCH: 33.3 pg — ABNORMAL HIGH (ref 26.6–33.0)
MCHC: 34.1 g/dL (ref 31.5–35.7)
MCV: 98 fL — ABNORMAL HIGH (ref 79–97)
Platelets: 350 10*3/uL (ref 150–450)
RBC: 4.02 x10E6/uL (ref 3.77–5.28)
RDW: 12 % (ref 11.7–15.4)
WBC: 10.2 10*3/uL (ref 3.4–10.8)

## 2024-01-04 LAB — VITAMIN D 25 HYDROXY (VIT D DEFICIENCY, FRACTURES): Vit D, 25-Hydroxy: 37.3 ng/mL (ref 30.0–100.0)

## 2024-01-04 LAB — TSH: TSH: 1.72 u[IU]/mL (ref 0.450–4.500)

## 2024-01-05 ENCOUNTER — Encounter: Payer: Self-pay | Admitting: Family Medicine

## 2024-01-05 DIAGNOSIS — J309 Allergic rhinitis, unspecified: Secondary | ICD-10-CM | POA: Insufficient documentation

## 2024-01-05 DIAGNOSIS — J40 Bronchitis, not specified as acute or chronic: Secondary | ICD-10-CM | POA: Insufficient documentation

## 2024-01-05 DIAGNOSIS — Z0001 Encounter for general adult medical examination with abnormal findings: Secondary | ICD-10-CM | POA: Insufficient documentation

## 2024-01-05 MED ORDER — AMPHETAMINE-DEXTROAMPHET ER 30 MG PO CP24: 30.0000 mg | ORAL_CAPSULE | ORAL | 0 refills | Status: DC

## 2024-01-05 NOTE — Assessment & Plan Note (Signed)
Tessalon perle and Z pack prescribed ?

## 2024-01-05 NOTE — Assessment & Plan Note (Signed)
 Daily montelukast for symptom control

## 2024-01-05 NOTE — Assessment & Plan Note (Signed)
Annual exam as documented. Counseling done  re healthy lifestyle involving commitment to 150 minutes exercise per week, heart healthy diet, and attaining healthy weight.The importance of adequate sleep also discussed. Regular seat belt use and home safety, is also discussed.  Immunization and cancer screening needs are specifically addressed at this visit.  

## 2024-01-05 NOTE — Assessment & Plan Note (Signed)
 Controlled, no change in medication

## 2024-04-27 ENCOUNTER — Telehealth: Payer: Self-pay

## 2024-04-27 ENCOUNTER — Other Ambulatory Visit: Payer: Self-pay | Admitting: Family Medicine

## 2024-04-27 MED ORDER — ETONOGESTREL-ETHINYL ESTRADIOL 0.12-0.015 MG/24HR VA RING: VAGINAL_RING | VAGINAL | 11 refills | Status: AC

## 2024-04-27 NOTE — Telephone Encounter (Signed)
 Pt needing refill of Nuvaring. Please refill is appropriate

## 2024-04-27 NOTE — Progress Notes (Signed)
Nuvaring refilled x 1 year.

## 2024-05-05 ENCOUNTER — Encounter: Payer: Self-pay | Admitting: Family Medicine

## 2024-05-05 ENCOUNTER — Ambulatory Visit (INDEPENDENT_AMBULATORY_CARE_PROVIDER_SITE_OTHER): Admitting: Family Medicine

## 2024-05-05 VITALS — BP 148/94 | HR 96 | Resp 16 | Ht 63.0 in | Wt 154.0 lb

## 2024-05-05 DIAGNOSIS — F902 Attention-deficit hyperactivity disorder, combined type: Secondary | ICD-10-CM

## 2024-05-05 DIAGNOSIS — I10 Essential (primary) hypertension: Secondary | ICD-10-CM

## 2024-05-05 DIAGNOSIS — E663 Overweight: Secondary | ICD-10-CM | POA: Diagnosis not present

## 2024-05-05 MED ORDER — AMLODIPINE BESYLATE 5 MG PO TABS: 5.0000 mg | ORAL_TABLET | Freq: Every day | ORAL | 2 refills | Status: DC

## 2024-05-05 NOTE — Patient Instructions (Addendum)
 F/U with MD in 4 months  Nurse bP check in 4 weeks  New for blood pressure is amlodipine   5 mg one daily  You CANNOT breast feed and take this medication as we discussed, your baby is 39 years old so breast feeding should be discontinued , even once per week  Hep B and HPV vaccines are recommended and we are able to give them to you in the office, please let us  know when you want to start the series  It is important that you exercise regularly at least 30 minutes 5 times a week. If you develop chest pain, have severe difficulty breathing, or feel very tired, stop exercising immediately and seek medical attention   Reduce salt intake  Thanks for choosing [facility], we consider it a privelige to serve you.

## 2024-05-07 ENCOUNTER — Ambulatory Visit (HOSPITAL_COMMUNITY)
Admission: EM | Admit: 2024-05-07 | Discharge: 2024-05-07 | Disposition: A | Discharge status: Home / Self Care | Attending: Emergency Medicine | Admitting: Emergency Medicine

## 2024-05-07 ENCOUNTER — Encounter (HOSPITAL_COMMUNITY): Payer: Self-pay

## 2024-05-07 DIAGNOSIS — I160 Hypertensive urgency: Secondary | ICD-10-CM

## 2024-05-07 DIAGNOSIS — R519 Headache, unspecified: Secondary | ICD-10-CM

## 2024-05-07 DIAGNOSIS — I10 Essential (primary) hypertension: Secondary | ICD-10-CM

## 2024-05-07 MED ORDER — METOCLOPRAMIDE HCL 5 MG/ML IJ SOLN
5.0000 mg | Freq: Once | INTRAMUSCULAR | Status: AC
  Administered 2024-05-07: 5 mg via INTRAMUSCULAR

## 2024-05-07 MED ORDER — DEXAMETHASONE SODIUM PHOSPHATE 10 MG/ML IJ SOLN
INTRAMUSCULAR | Status: AC
  Filled 2024-05-07: qty 1

## 2024-05-07 MED ORDER — CLONIDINE HCL 0.1 MG PO TABS
0.1000 mg | ORAL_TABLET | Freq: Once | ORAL | Status: AC
  Administered 2024-05-07: 0.1 mg via ORAL

## 2024-05-07 MED ORDER — DEXAMETHASONE SODIUM PHOSPHATE 10 MG/ML IJ SOLN
10.0000 mg | Freq: Once | INTRAMUSCULAR | Status: AC
  Administered 2024-05-07: 10 mg via INTRAMUSCULAR

## 2024-05-07 MED ORDER — KETOROLAC TROMETHAMINE 30 MG/ML IJ SOLN
INTRAMUSCULAR | Status: AC
  Filled 2024-05-07: qty 1

## 2024-05-07 MED ORDER — KETOROLAC TROMETHAMINE 30 MG/ML IJ SOLN
30.0000 mg | Freq: Once | INTRAMUSCULAR | Status: AC
  Administered 2024-05-07: 30 mg via INTRAMUSCULAR

## 2024-05-07 MED ORDER — CLONIDINE HCL 0.1 MG PO TABS
ORAL_TABLET | ORAL | Status: AC
  Filled 2024-05-07: qty 1

## 2024-05-07 MED ORDER — METOCLOPRAMIDE HCL 5 MG/ML IJ SOLN
INTRAMUSCULAR | Status: AC
Start: 2024-05-07 — End: 2024-05-07
  Filled 2024-05-07: qty 2

## 2024-05-07 NOTE — Discharge Instructions (Signed)
 Please go home and get some rest. If you find that your blood pressure continues to rise or your headache gets worse, please seek immediate care at the nearest emergency department.   Start taking your amlodipine  tonight before bed and monitor your blood pressure in the morning before breakfast and at night before bed, keep a log of this in a notebook or on your phone.    Attached is a DASH diet, please try to follow this to help lower your blood pressure. Managing stress and anxiety can help as well.   Follow-up with your PCP for further management of your blood pressure.

## 2024-05-07 NOTE — ED Triage Notes (Signed)
 Pt c/o headache x 2days. States took tylenol  with no relief. States her b/p was elevated. States was prescribed b/p meds on Tuesday by PCP but hasn't picked it up yet.

## 2024-05-07 NOTE — ED Provider Notes (Signed)
 [facility]    CSN: [id number] Arrival date & time: 05/07/24  1049      History   Chief Complaint Chief Complaint  Patient presents with   Headache    HPI Danielle Shah is a 38 y.o. female.   Patient presents to clinic over concern of a frontal headache that started 3 days ago.  She had notified her primary care and in the office her blood pressure had been elevated, steadily climbing so she was started on amlodipine .  Patient is not really fond of oral medications so she has not yet picked this up.  Did have some central chest pressure earlier, this is since resolved.  No current chest pain.  Her headache is gotten worse over the past few days.  Tried Tylenol  without any improvement.  Was checking her blood pressure at work and noticed that it was continually elevated.  History of anemia, anxiety, depression and hypertension.    The history is provided by the patient and medical records.  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   Hyperlipidemia LDL goal <100 10/16/2023   Vitamin D  deficiency 10/16/2023   Back pain with radiculopathy 11/11/2022   Elevated blood pressure reading in office without diagnosis of hypertension 11/11/2022   Subacromial bursitis of right shoulder joint 10/23/2022   Acute pain of right shoulder 08/09/2022   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   ADHD (attention deficit hyperactivity disorder) 01/31/2018   Anxiety and depression 12/14/2017   Depression, major, single episode, severe (HCC) 12/14/2017   Overweight (BMI 25.0-29.9) 11/11/2009   Herpes simplex virus (HSV) infection 10/09/2008    Past Surgical History:  Procedure  Laterality Date   EYE SURGERY Right 2003   WISDOM TOOTH EXTRACTION  2004    OB History     Gravida  4   Para  2   Term  2   Preterm      AB  2   Living  2      SAB  1   IAB  1   Ectopic      Multiple  0   Live Births  2            Home Medications    Prior to Admission medications   Medication Sig Start Date End Date Taking? Authorizing Provider  amLODipine  (NORVASC ) 5 MG tablet Take 1 tablet (5 mg total) by mouth daily. Patient not taking: Reported on 05/07/2024 05/05/24   Antonetta Rollene BRAVO, MD  amphetamine -dextroamphetamine (ADDERALL XR) 30 MG 24 hr capsule Take 1 capsule (30 mg total) by mouth every morning. 08/16/23   Antonetta Rollene BRAVO, MD  amphetamine -dextroamphetamine (ADDERALL XR) 30 MG 24 hr capsule Take 1 capsule (30 mg total) by mouth every morning. 12/18/23 01/17/24  Antonetta Rollene BRAVO, MD  amphetamine -dextroamphetamine (ADDERALL XR) 30 MG 24 hr capsule Take 1 capsule (30 mg total) by mouth every morning. 01/19/24 02/18/24  Antonetta Rollene BRAVO, MD  amphetamine -dextroamphetamine (ADDERALL XR) 30 MG 24 hr capsule Take 1 capsule (30 mg total) by mouth every morning. 02/18/24 03/19/24  Antonetta Rollene BRAVO, MD  amphetamine -dextroamphetamine (ADDERALL XR) 30 MG 24 hr capsule  Take 1 capsule (30 mg total) by mouth every morning. 03/19/24 04/18/24  Antonetta Rollene BRAVO, MD  amphetamine -dextroamphetamine (ADDERALL XR) 30 MG 24 hr capsule Take 1 capsule (30 mg total) by mouth every morning. 04/18/24 05/18/24  Antonetta Rollene BRAVO, MD  azelastine  (ASTELIN ) 0.1 % nasal spray Place 2 sprays into both nostrils 2 (two) times daily. Use in each nostril as directed 01/03/24   Antonetta Rollene BRAVO, MD  etonogestrel -ethinyl estradiol  (NUVARING) 0.12-0.015 MG/24HR vaginal ring INSERT 1 RING VAGINALLY AS DIRECTED. REMOVE AFTER 3 WEEKS & WAIT 7 DAYS BEFORE INSERTING A NEW RING 04/27/24   Antonetta Rollene BRAVO, MD  montelukast  (SINGULAIR ) 10 MG tablet Take 1 tablet (10 mg total) by mouth at  bedtime. 01/03/24   Antonetta Rollene BRAVO, MD  tamsulosin  (FLOMAX ) 0.4 MG CAPS capsule Take 1 capsule (0.4 mg total) by mouth daily. 07/15/23   Larocco, Lauraine BROCKS, FNP  Vitamin D , Ergocalciferol , (DRISDOL ) 1.25 MG (50000 UNIT) CAPS capsule Take 1 capsule (50,000 Units total) by mouth every 7 (seven) days. Patient not taking: Reported on 05/05/2024 11/08/22   Antonetta Rollene BRAVO, MD    Family History Family History  Problem Relation Age of Onset   Heart disease Mother        CHF   Arthritis Mother    Hyperlipidemia Mother    Hypertension Mother     Social History Social History   Tobacco Use   Smoking status: Former    Types: Cigarettes   Smokeless tobacco: Never  Vaping Use   Vaping status: Never Used  Substance Use Topics   Alcohol use: Not Currently    Comment: social drinking   Drug use: Not Currently    Types: Marijuana    Comment: last use thanksgiving     Allergies   Penicillins   Review of Systems Review of Systems  Per HPI  Physical Exam Triage Vital Signs ED Triage Vitals  Encounter Vitals Group     BP 05/07/24 1151 (!) 144/101     Girls Systolic BP Percentile --      Girls Diastolic BP Percentile --      Boys Systolic BP Percentile --      Boys Diastolic BP Percentile --      Pulse Rate 05/07/24 1151 (!) 105     Resp 05/07/24 1151 18     Temp 05/07/24 1151 98.5 F (36.9 C)     Temp Source 05/07/24 1151 Oral     SpO2 05/07/24 1151 97 %     Weight --      Height --      Head Circumference --      Peak Flow --      Pain Score 05/07/24 1152 7     Pain Loc --      Pain Education --      Exclude from Growth Chart --    No data found.  Updated Vital Signs BP (!) 165/107 (BP Location: Left Arm)   Pulse 70   Temp 98.5 F (36.9 C) (Oral)   Resp 18   SpO2 98%   Breastfeeding No   Visual Acuity Right Eye Distance:   Left Eye Distance:   Bilateral Distance:    Right Eye Near:   Left Eye Near:    Bilateral Near:     Physical Exam Vitals and  nursing note reviewed.  Constitutional:      Appearance: Normal appearance.  HENT:     Head: Normocephalic and atraumatic.  Right Ear: External ear normal.     Left Ear: External ear normal.     Nose: Nose normal.     Mouth/Throat:     Mouth: Mucous membranes are moist.  Eyes:     Extraocular Movements: Extraocular movements intact.     Conjunctiva/sclera: Conjunctivae normal.     Pupils: Pupils are equal, round, and reactive to light.  Cardiovascular:     Rate and Rhythm: Normal rate.  Pulmonary:     Effort: Pulmonary effort is normal. No respiratory distress.  Skin:    General: Skin is warm and dry.  Neurological:     General: No focal deficit present.     Mental Status: She is alert and oriented to person, place, and time.     GCS: GCS eye subscore is 4. GCS verbal subscore is 5. GCS motor subscore is 6.  Psychiatric:        Mood and Affect: Mood normal.        Behavior: Behavior normal. Behavior is cooperative.      UC Treatments / Results  Labs (all labs ordered are listed, but only abnormal results are displayed) Labs Reviewed - No data to display  EKG   Radiology No results found.  Procedures Procedures (including critical care time)  Medications Ordered in UC Medications  ketorolac  (TORADOL ) 30 MG/ML injection 30 mg (has no administration in time range)  metoCLOPramide  (REGLAN ) injection 5 mg (has no administration in time range)  dexamethasone  (DECADRON ) injection 10 mg (has no administration in time range)  cloNIDine  (CATAPRES ) tablet 0.1 mg (0.1 mg Oral Given 05/07/24 1240)    Initial Impression / Assessment and Plan / UC Course  I have reviewed the triage vital signs and the nursing notes.  Pertinent labs & imaging results that were available during my care of the patient were reviewed by me and considered in my medical decision making (see chart for details).  Vitals and triage reviewed, patient is hemodynamically stable.  PERRLA, EOMI.  GCS  15.  EKG showed NSR, without ST elevation or ST depression.  Headache improved after clonidine , blood pressure remains elevated.  Suspect hypertensive urgency.  Encouraged compliance with amlodipine .  IM migraine cocktail given in clinic to see if we can further improve headache.  Suspect anxiety may be contributing to this as well.  Plan of care, follow-up care and strict emergency precautions given if symptoms evolve, no questions at this time.      Final Clinical Impressions(s) / UC Diagnoses   Final diagnoses:  Essential hypertension  Bad headache  Hypertensive urgency     Discharge Instructions      Please go home and get some rest. If you find that your blood pressure continues to rise or your headache gets worse, please seek immediate care at the nearest emergency department.   Start taking your amlodipine  tonight before bed and monitor your blood pressure in the morning before breakfast and at night before bed, keep a log of this in a notebook or on your phone.    Attached is a DASH diet, please try to follow this to help lower your blood pressure. Managing stress and anxiety can help as well.   Follow-up with your PCP for further management of your blood pressure.       ED Prescriptions   None    PDMP not reviewed this encounter.   Dreama, Eliza Grissinger  N, FNP 05/07/24 1340

## 2024-05-08 ENCOUNTER — Emergency Department (HOSPITAL_COMMUNITY)

## 2024-05-08 ENCOUNTER — Other Ambulatory Visit: Payer: Self-pay

## 2024-05-08 ENCOUNTER — Emergency Department (HOSPITAL_COMMUNITY)
Admission: EM | Admit: 2024-05-08 | Discharge: 2024-05-08 | Discharge status: Against Medical Advice | Attending: Emergency Medicine | Admitting: Emergency Medicine

## 2024-05-08 ENCOUNTER — Ambulatory Visit: Payer: Self-pay

## 2024-05-08 ENCOUNTER — Encounter (HOSPITAL_COMMUNITY): Payer: Self-pay

## 2024-05-08 DIAGNOSIS — R519 Headache, unspecified: Secondary | ICD-10-CM | POA: Diagnosis present

## 2024-05-08 DIAGNOSIS — Z5321 Procedure and treatment not carried out due to patient leaving prior to being seen by health care provider: Secondary | ICD-10-CM | POA: Insufficient documentation

## 2024-05-08 DIAGNOSIS — I10 Essential (primary) hypertension: Secondary | ICD-10-CM | POA: Diagnosis not present

## 2024-05-08 LAB — CBC
HCT: 40.5 % (ref 36.0–46.0)
Hemoglobin: 13.5 g/dL (ref 12.0–15.0)
MCH: 33.5 pg (ref 26.0–34.0)
MCHC: 33.3 g/dL (ref 30.0–36.0)
MCV: 100.5 fL — ABNORMAL HIGH (ref 80.0–100.0)
Platelets: 379 K/uL (ref 150–400)
RBC: 4.03 MIL/uL (ref 3.87–5.11)
RDW: 12.4 % (ref 11.5–15.5)
WBC: 17.5 K/uL — ABNORMAL HIGH (ref 4.0–10.5)
nRBC: 0 % (ref 0.0–0.2)

## 2024-05-08 LAB — HCG, SERUM, QUALITATIVE: Preg, Serum: NEGATIVE

## 2024-05-08 LAB — BASIC METABOLIC PANEL WITH GFR
Anion gap: 9 (ref 5–15)
BUN: 12 mg/dL (ref 6–20)
CO2: 22 mmol/L (ref 22–32)
Calcium: 9.4 mg/dL (ref 8.9–10.3)
Chloride: 108 mmol/L (ref 98–111)
Creatinine, Ser: 0.71 mg/dL (ref 0.44–1.00)
GFR, Estimated: >60 mL/min (ref >60)
Glucose, Bld: 95 mg/dL (ref 70–99)
Potassium: 3.9 mmol/L (ref 3.5–5.1)
Sodium: 139 mmol/L (ref 135–145)

## 2024-05-08 LAB — D-DIMER, QUANTITATIVE: D-Dimer, Quant: <0.27 ug{FEU}/mL (ref 0.00–0.50)

## 2024-05-08 LAB — TROPONIN I (HIGH SENSITIVITY): Troponin I (High Sensitivity): 3 ng/L (ref <18)

## 2024-05-08 NOTE — ED Notes (Signed)
 Pt left to get food and walked out the front doors.

## 2024-05-08 NOTE — Telephone Encounter (Signed)
 Patient triaged to ED for symptomatic HTN with severe headache. 911 precautions reviewed. Denies weakness, dizziness, numbness.  FYI Only or Action Required?: FYI only for provider.  Patient was last seen in primary care on 05/05/2024 by Antonetta Rollene BRAVO, MD.  Called Nurse Triage reporting Hypertension.  Symptoms began several days ago.  Interventions attempted: Prescription medications: amlodipine  (plus clonidine  at [facility]).  Symptoms are: unchanged.  Triage Disposition: Go to ED Now (Notify PCP)  Patient/caregiver understands and will follow disposition?: Yes Reason for Disposition  [1] Systolic BP >= 160 OR Diastolic >= 100 AND [2] cardiac (e.g., breathing difficulty, chest pain) or neurologic symptoms (e.g., new-onset blurred or double vision, unsteady gait)  Answer Assessment - Initial Assessment Questions 1. BLOOD PRESSURE: What is your blood pressure? Did you take at least two measurements 5 minutes apart?     163/118  2. ONSET: When did you take your blood pressure?     Prior to calling  3. HOW: How did you take your blood pressure? (e.g., automatic home BP monitor, visiting nurse)     Machine  4. HISTORY: Do you have a history of high blood pressure?     Yes, see recent visit notes  5. MEDICINES: Are you taking any medicines for blood pressure? Have you missed any doses recently?     Started amlodipine  at 2000 on 05/07/24  6. OTHER SYMPTOMS: Do you have any symptoms? (e.g., blurred vision, chest pain, difficulty breathing, headache, weakness)     Severe headache  Protocols used: Blood Pressure - High-A-AH Copied from CRM #[id number]. Topic: Clinical - Red Word Triage >> May 08, 2024 10:25 AM Winona R wrote: Pt came in on 07/29 for check up, was then put on BP med amLODipine  (NORVASC ) 5 MG tablet. Pt did not get a chance to pick it up on th 30th due to a headache but went to UC on the 31st for a headache and was discharged with a BP of 177/108. Pt was also  give a shot which helped the headache then. Pt took the amLODipine  (NORVASC ) 5 MG tablet yesterday at 8pm yet her BP is still high at 163/198 and she still has a headache

## 2024-05-08 NOTE — ED Provider Triage Note (Addendum)
 Emergency Medicine Provider Triage Evaluation Note  Danielle Shah , a 38 y.o. female  was evaluated in triage.  Pt complains of hypertension, headache, calf pain bilaterally. She was seen by her PCP on Tuesday and started on Amlodipine  for hypertension.  She went to urgent care yesterday because she was found to have high blood pressure while at work, they gave her clonidine  as well as some medications for her headache and then sent her home, her headache persists.  She took her amlodipine  last night around 815, was found to be hypertensive again today, she is only taken 1 dose of her amlodipine  and is not on any other blood pressure medications.  She reports headache ongoing for several days, like I can feel my pulse in my head.  She reports bilateral calf pain for several weeks, she requests bilateral lower extremity Dopplers because she is on hormonal contraception and several weeks ago had a flight to [place], she is concerned that she may be having a DVT and that is resulting in her other symptoms.  She also complains of chest pressure.  She denies vision changes, shortness of breath, lower extremity edema.  Review of Systems  Positive: As above Negative: As above  Physical Exam  BP (!) 152/98 (BP Location: Right Arm)   Pulse 97   Temp 98.9 F (37.2 C)   Resp 18   Ht 5' 3 (1.6 m)   Wt 69.4 kg   SpO2 100%   BMI 27.10 kg/m  Gen:   Awake, no distress   Resp:  Normal effort  MSK:   Moves extremities without difficulty  Other:  Normal neurologic exam, no focal neurodeficit.  No LE edema bilaterally.  Medical Decision Making  Medically screening exam initiated at 1:26 PM.  Appropriate orders placed.  Venora GORMAN Ford was informed that the remainder of the evaluation will be completed by another provider, this initial triage assessment does not replace that evaluation, and the importance of remaining in the ED until their evaluation is complete.  Patient request bilateral lower extremity  Dopplers, however I do not feel that this is necessary given she does not have any bilateral lower extremity signs of DVT, such as swelling/erythema/warmth.  I do feel that a D-dimer is reasonable, she is on OCPs therefore cannot PERC out, she also recently had a flight to [place] which was hours long.  Low suspicion for DVT/PE. Was given Toradol /Reglan /Decadron  at [facility] yesterday for HA, Clonidine  for HTN.    Glendia Rocky SAILOR, [place] 05/08/24 1332

## 2024-05-08 NOTE — ED Triage Notes (Addendum)
 Pt came in via POV d/t recent HTN & HA, plus bil calve pain for weeks & for one instance she felt her Lt arm go numb a few days ago for approx. 5 minutes & that has not happened again. Pt reports that she saw her PCP on Tuesday & was given 5mg  Amlodipine  to take, started it last night, before arriving to ED her BP at home was 163/118. From Wed until now she has dealt with a bad HA & when at [facility] no meds have help alleviate the pain. A/Ox4, rates her pain 9/10 during triage & endorses feeling chest palpitations since last night as well. Does report traveling to [place] at the end of June & the calve pain has been since then.

## 2024-05-08 NOTE — ED Notes (Signed)
 Pt. Called x3 w/ no response. Moved OTF

## 2024-05-10 ENCOUNTER — Encounter: Payer: Self-pay | Admitting: Family Medicine

## 2024-05-10 DIAGNOSIS — I10 Essential (primary) hypertension: Secondary | ICD-10-CM | POA: Insufficient documentation

## 2024-05-10 MED ORDER — AMPHETAMINE-DEXTROAMPHET ER 30 MG PO CP24: 30.0000 mg | ORAL_CAPSULE | ORAL | 0 refills | Status: DC

## 2024-05-10 MED ORDER — AMPHETAMINE-DEXTROAMPHET ER 30 MG PO CP24: 30.0000 mg | ORAL_CAPSULE | ORAL | 0 refills | Status: AC

## 2024-05-10 NOTE — Assessment & Plan Note (Signed)
Improved on medication, continue same 

## 2024-05-10 NOTE — Assessment & Plan Note (Signed)
  Patient re-educated about  the importance of commitment to a  minimum of 150 minutes of exercise per week as able.  The importance of healthy food choices with portion control discussed, as well as eating regularly and within a 12 hour window most days. The need to choose clean , green food 50 to 75% of the time is discussed, as well as to make water the primary drink and set a goal of 64 ounces water daily.       05/08/2024   12:41 PM 05/05/2024    3:39 PM 01/03/2024    3:41 PM  Weight /BMI  Weight 153 lb 154 lb 0.6 oz 156 lb  Height 5' 3 (1.6 m) 5' 3 (1.6 m) 5' 3 (1.6 m)  BMI 27.1 kg/m2 27.29 kg/m2 27.63 kg/m2    Unchanged

## 2024-05-10 NOTE — Progress Notes (Signed)
 Danielle Shah     MRN: [id number]      DOB: 1985-12-24  Chief Complaint  Patient presents with   Medical Management of Chronic Issues    Follow up     HPI Danielle Shah is here for follow up and re-evaluation of chronic medical conditions, medication management and review of any available recent lab and radiology data.  Preventive health is updated, specifically  Cancer screening and Immunization.   Questions or concerns regarding consultations or procedures which the PT has had in the interim are  addressed. The PT denies any adverse reactions to current medications since the last visit.  There are no new concerns.  There are no specific complaints   ROS Denies recent fever or chills. Denies sinus pressure, nasal congestion, ear pain or sore throat. Denies chest congestion, productive cough or wheezing. Denies chest pains, palpitations and leg swelling Denies abdominal pain, nausea, vomiting,diarrhea or constipation.   Denies dysuria, frequency, hesitancy or incontinence. Denies joint pain, swelling and limitation in mobility. Denies headaches, seizures, numbness, or tingling. Denies depression, anxiety or insomnia. Denies skin break down or rash.   PE  BP (!) 148/94   Pulse 96   Resp 16   Ht 5' 3 (1.6 m)   Wt 154 lb 0.6 oz (69.9 kg)   SpO2 98%   BMI 27.29 kg/m   Patient alert and oriented and in no cardiopulmonary distress.  HEENT: No facial asymmetry, EOMI,     Neck supple .  Chest: Clear to auscultation bilaterally.  CVS: S1, S2 no murmurs, no S3.Regular rate.  ABD: Soft non tender.   Ext: No edema  MS: Adequate ROM spine, shoulders, hips and knees.  Skin: Intact, no ulcerations or rash noted.  Psych: Good eye contact, normal affect. Memory intact not anxious or depressed appearing.  CNS: CN 2-12 intact, power,  normal throughout.no focal deficits noted.   Assessment & Plan  Essential hypertension Start amlodipine  5 mg daily DASH diet and  commitment to daily physical activity for a minimum of 30 minutes discussed and encouraged, as a part of hypertension management. The importance of attaining a healthy weight is also discussed.     05/08/2024   12:41 PM 05/08/2024   11:28 AM 05/07/2024    1:19 PM 05/07/2024   11:51 AM 05/05/2024    3:58 PM 05/05/2024    3:40 PM 05/05/2024    3:39 PM  BP/Weight  Systolic BP  152 834 144 148 154 164  Diastolic BP  98 107 101 94 111 114  Wt. (Lbs) 153      154.04  BMI 27.1 kg/m2      27.29 kg/m2     Nurse BP chreck in 4 weeks  Overweight (BMI 25.0-29.9)  Patient re-educated about  the importance of commitment to a  minimum of 150 minutes of exercise per week as able.  The importance of healthy food choices with portion control discussed, as well as eating regularly and within a 12 hour window most days. The need to choose clean , green food 50 to 75% of the time is discussed, as well as to make water the primary drink and set a goal of 64 ounces water daily.       05/08/2024   12:41 PM 05/05/2024    3:39 PM 01/03/2024    3:41 PM  Weight /BMI  Weight 153 lb 154 lb 0.6 oz 156 lb  Height 5' 3 (1.6 m) 5' 3 (1.6 m) 5'  3 (1.6 m)  BMI 27.1 kg/m2 27.29 kg/m2 27.63 kg/m2    Unchanged  ADHD (attention deficit hyperactivity disorder) Improved on medication , continue same

## 2024-05-10 NOTE — Assessment & Plan Note (Signed)
 Start amlodipine  5 mg daily DASH diet and commitment to daily physical activity for a minimum of 30 minutes discussed and encouraged, as a part of hypertension management. The importance of attaining a healthy weight is also discussed.     05/08/2024   12:41 PM 05/08/2024   11:28 AM 05/07/2024    1:19 PM 05/07/2024   11:51 AM 05/05/2024    3:58 PM 05/05/2024    3:40 PM 05/05/2024    3:39 PM  BP/Weight  Systolic BP  152 834 144 148 154 164  Diastolic BP  98 107 101 94 111 114  Wt. (Lbs) 153      154.04  BMI 27.1 kg/m2      27.29 kg/m2     Nurse BP chreck in 4 weeks

## 2024-05-11 ENCOUNTER — Encounter: Payer: Self-pay | Admitting: Family Medicine

## 2024-05-26 ENCOUNTER — Encounter: Payer: Self-pay | Admitting: Family Medicine

## 2024-05-26 ENCOUNTER — Other Ambulatory Visit: Payer: Self-pay

## 2024-05-26 DIAGNOSIS — D72829 Elevated white blood cell count, unspecified: Secondary | ICD-10-CM

## 2024-05-27 ENCOUNTER — Ambulatory Visit (INDEPENDENT_AMBULATORY_CARE_PROVIDER_SITE_OTHER)

## 2024-05-27 DIAGNOSIS — D72829 Elevated white blood cell count, unspecified: Secondary | ICD-10-CM | POA: Diagnosis not present

## 2024-05-27 DIAGNOSIS — I10 Essential (primary) hypertension: Secondary | ICD-10-CM | POA: Diagnosis not present

## 2024-05-27 NOTE — Progress Notes (Signed)
 Patient is in office today for a nurse visit for Blood Pressure Check. Patients Blood Pressure was 136/96.

## 2024-05-28 ENCOUNTER — Ambulatory Visit: Payer: Self-pay | Admitting: Family Medicine

## 2024-05-28 LAB — CBC WITH DIFFERENTIAL/PLATELET
Basophils Absolute: 0 x10E3/uL (ref 0.0–0.2)
Basos: 0 %
EOS (ABSOLUTE): 0.1 x10E3/uL (ref 0.0–0.4)
Eos: 1 %
Hematocrit: 39.7 % (ref 34.0–46.6)
Hemoglobin: 13.3 g/dL (ref 11.1–15.9)
Immature Grans (Abs): 0 x10E3/uL (ref 0.0–0.1)
Immature Granulocytes: 0 %
Lymphocytes Absolute: 2.8 x10E3/uL (ref 0.7–3.1)
Lymphs: 34 %
MCH: 33.5 pg — ABNORMAL HIGH (ref 26.6–33.0)
MCHC: 33.5 g/dL (ref 31.5–35.7)
MCV: 100 fL — ABNORMAL HIGH (ref 79–97)
Monocytes Absolute: 0.4 x10E3/uL (ref 0.1–0.9)
Monocytes: 5 %
Neutrophils Absolute: 4.9 x10E3/uL (ref 1.4–7.0)
Neutrophils: 60 %
Platelets: 377 x10E3/uL (ref 150–450)
RBC: 3.97 x10E6/uL (ref 3.77–5.28)
RDW: 12.2 % (ref 11.7–15.4)
WBC: 8.2 x10E3/uL (ref 3.4–10.8)

## 2024-09-02 ENCOUNTER — Other Ambulatory Visit: Payer: Self-pay | Admitting: Family Medicine

## 2024-09-11 ENCOUNTER — Ambulatory Visit (INDEPENDENT_AMBULATORY_CARE_PROVIDER_SITE_OTHER): Admitting: Family Medicine

## 2024-09-11 ENCOUNTER — Encounter: Payer: Self-pay | Admitting: Family Medicine

## 2024-09-11 VITALS — BP 133/88 | HR 92 | Resp 18 | Ht 63.0 in | Wt 155.0 lb

## 2024-09-11 DIAGNOSIS — I10 Essential (primary) hypertension: Secondary | ICD-10-CM | POA: Diagnosis not present

## 2024-09-11 DIAGNOSIS — F902 Attention-deficit hyperactivity disorder, combined type: Secondary | ICD-10-CM

## 2024-09-11 DIAGNOSIS — E663 Overweight: Secondary | ICD-10-CM | POA: Diagnosis not present

## 2024-09-11 DIAGNOSIS — Z23 Encounter for immunization: Secondary | ICD-10-CM | POA: Diagnosis not present

## 2024-09-11 DIAGNOSIS — J309 Allergic rhinitis, unspecified: Secondary | ICD-10-CM

## 2024-09-11 NOTE — Patient Instructions (Signed)
 F/U in 12 weeks  HPV and flu vaccines today  Nurse visit for hPV #2 in 2 months  Work on exercise and healthy eating   No med changes  Thanks for choosing [facility], we consider it a privelige to serve you.

## 2024-09-13 ENCOUNTER — Encounter: Payer: Self-pay | Admitting: Family Medicine

## 2024-09-13 DIAGNOSIS — Z23 Encounter for immunization: Secondary | ICD-10-CM | POA: Insufficient documentation

## 2024-09-13 MED ORDER — AMPHETAMINE-DEXTROAMPHET ER 30 MG PO CP24: 30.0000 mg | ORAL_CAPSULE | ORAL | 0 refills | Status: AC

## 2024-09-13 NOTE — Assessment & Plan Note (Signed)
 Controlled, no change in medication DASH diet and commitment to daily physical activity for a minimum of 30 minutes discussed and encouraged, as a part of hypertension management. The importance of attaining a healthy weight is also discussed.     09/11/2024    4:05 PM 09/11/2024    3:48 PM 05/27/2024    8:25 AM 05/08/2024   12:41 PM 05/08/2024   11:28 AM 05/07/2024    1:19 PM 05/07/2024   11:51 AM  BP/Weight  Systolic BP  133 863  152 165 855  Diastolic BP  88 96  98 107 101  Wt. (Lbs) 155 158.04 151.12 153     BMI 27.46 kg/m2 28.91 kg/m2 26.77 kg/m2 27.1 kg/m2

## 2024-09-13 NOTE — Assessment & Plan Note (Signed)
 After obtaining informed consent, the influenza and HPV  vaccines are   administered , with no adverse effect noted at the time of administration.

## 2024-09-13 NOTE — Assessment & Plan Note (Signed)
  Patient re-educated about  the importance of commitment to a  minimum of 150 minutes of exercise per week as able.  The importance of healthy food choices with portion control discussed, as well as eating regularly and within a 12 hour window most days. The need to choose clean , green food 50 to 75% of the time is discussed, as well as to make water the primary drink and set a goal of 64 ounces water daily.       09/11/2024    4:05 PM 09/11/2024    3:48 PM 05/27/2024    8:25 AM  Weight /BMI  Weight 155 lb 158 lb 0.6 oz 151 lb 1.9 oz  Height 5' 3 (1.6 m) 5' 2 (1.575 m)   BMI 27.46 kg/m2 28.91 kg/m2 26.77 kg/m2

## 2024-09-13 NOTE — Assessment & Plan Note (Signed)
 No current flare uses singulair  as needed

## 2024-09-13 NOTE — Progress Notes (Signed)
 Danielle Shah     MRN: [id number]      DOB: 11-05-1985  Chief Complaint  Patient presents with   Hypertension    Follow up     HPI Danielle Shah is here for follow up and re-evaluation of chronic medical conditions, medication management and review of any available recent lab and radiology data.  Preventive health is updated, specifically  Cancer screening and Immunization.   Recently in ED with acute GE following travel [place] states her BP was markedly elevated at the time Has since committed to taking meds daily and started exercise class 2 weeks ago The PT denies any adverse reactions to current medications since the last visit.     ROS Denies recent fever or chills. Denies sinus pressure, nasal congestion, ear pain or sore throat. Denies chest congestion, productive cough or wheezing. Denies chest pains, palpitations and leg swelling Denies abdominal pain, nausea, vomiting,diarrhea or constipation.   Denies dysuria, frequency, hesitancy or incontinence. Denies  uncontrolled joint pain, swelling and limitation in mobility. Denies headaches, seizures, numbness, or tingling. Denies depression, anxiety or insomnia. Denies skin break down or rash.   PE  BP 133/88   Pulse 92   Resp 18   Ht 5' 3 (1.6 m)   Wt 155 lb (70.3 kg)   LMP 08/31/2024 (Approximate)   SpO2 94%   BMI 27.46 kg/m   Patient alert and oriented and in no cardiopulmonary distress.  HEENT: No facial asymmetry, EOMI,     Neck supple .  Chest: Clear to auscultation bilaterally.  CVS: S1, S2 no murmurs, no S3.Regular rate.    Ext: No edema  MS: Adequate ROM spine, shoulders, hips and knees.  Skin: Intact, no ulcerations or rash noted.  Psych: Good eye contact, normal affect. Memory intact not anxious or depressed appearing.  CNS: CN 2-12 intact, power,  normal throughout.no focal deficits noted.   Assessment & Plan  ADHD (attention deficit hyperactivity disorder) Controlled, no change in  medication   Allergic rhinosinusitis No current flare uses singulair  as needed  Essential hypertension Controlled, no change in medication DASH diet and commitment to daily physical activity for a minimum of 30 minutes discussed and encouraged, as a part of hypertension management. The importance of attaining a healthy weight is also discussed.     09/11/2024    4:05 PM 09/11/2024    3:48 PM 05/27/2024    8:25 AM 05/08/2024   12:41 PM 05/08/2024   11:28 AM 05/07/2024    1:19 PM 05/07/2024   11:51 AM  BP/Weight  Systolic BP  133 863  152 165 855  Diastolic BP  88 96  98 107 101  Wt. (Lbs) 155 158.04 151.12 153     BMI 27.46 kg/m2 28.91 kg/m2 26.77 kg/m2 27.1 kg/m2          Overweight (BMI 25.0-29.9)  Patient re-educated about  the importance of commitment to a  minimum of 150 minutes of exercise per week as able.  The importance of healthy food choices with portion control discussed, as well as eating regularly and within a 12 hour window most days. The need to choose clean , green food 50 to 75% of the time is discussed, as well as to make water the primary drink and set a goal of 64 ounces water daily.       09/11/2024    4:05 PM 09/11/2024    3:48 PM 05/27/2024    8:25 AM  Weight /BMI  Weight 155 lb 158 lb 0.6 oz 151 lb 1.9 oz  Height 5' 3 (1.6 m) 5' 2 (1.575 m)   BMI 27.46 kg/m2 28.91 kg/m2 26.77 kg/m2      Immunization due After obtaining informed consent, the influenza and HPV  vaccines are   administered , with no adverse effect noted at the time of administration.

## 2024-09-13 NOTE — Assessment & Plan Note (Signed)
 Controlled, no change in medication

## 2024-11-16 ENCOUNTER — Ambulatory Visit

## 2024-12-04 ENCOUNTER — Ambulatory Visit: Admitting: Family Medicine
